# Patient Record
Sex: Male | Born: 1995 | Race: Black or African American | Hispanic: No | Marital: Single | Smoking: Never smoker
Health system: Southern US, Community
[De-identification: ages and names within clinical notes are randomized; demographics above are authoritative.]

## PROBLEM LIST (undated history)

## (undated) ENCOUNTER — Ambulatory Visit (HOSPITAL_COMMUNITY): Admission: EM | Payer: No Typology Code available for payment source | Source: Home / Self Care

## (undated) DIAGNOSIS — J45909 Unspecified asthma, uncomplicated: Secondary | ICD-10-CM

---
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
## 2013-06-22 ENCOUNTER — Emergency Department (HOSPITAL_COMMUNITY)
Admission: EM | Admit: 2013-06-22 | Discharge: 2013-06-22 | Disposition: A | Discharge status: Home / Self Care | Payer: 59 | Attending: Emergency Medicine | Admitting: Emergency Medicine

## 2013-06-22 ENCOUNTER — Emergency Department (HOSPITAL_COMMUNITY): Payer: 59

## 2013-06-22 ENCOUNTER — Encounter (HOSPITAL_COMMUNITY): Payer: Self-pay | Admitting: Emergency Medicine

## 2013-06-22 DIAGNOSIS — R5383 Other fatigue: Secondary | ICD-10-CM

## 2013-06-22 DIAGNOSIS — IMO0001 Reserved for inherently not codable concepts without codable children: Secondary | ICD-10-CM | POA: Insufficient documentation

## 2013-06-22 DIAGNOSIS — J069 Acute upper respiratory infection, unspecified: Secondary | ICD-10-CM | POA: Insufficient documentation

## 2013-06-22 DIAGNOSIS — Z79899 Other long term (current) drug therapy: Secondary | ICD-10-CM | POA: Insufficient documentation

## 2013-06-22 DIAGNOSIS — R5381 Other malaise: Secondary | ICD-10-CM | POA: Insufficient documentation

## 2013-06-22 DIAGNOSIS — J45901 Unspecified asthma with (acute) exacerbation: Secondary | ICD-10-CM

## 2013-06-22 DIAGNOSIS — R509 Fever, unspecified: Secondary | ICD-10-CM

## 2013-06-22 MED ORDER — ALBUTEROL SULFATE HFA 108 (90 BASE) MCG/ACT IN AERS
2.0000 | INHALATION_SPRAY | RESPIRATORY_TRACT | Status: DC | PRN
  Administered 2013-06-22: 2 via RESPIRATORY_TRACT
  Filled 2013-06-22 (×2): qty 6.7

## 2013-06-22 MED ORDER — IBUPROFEN 400 MG PO TABS
600.0000 mg | ORAL_TABLET | Freq: Once | ORAL | Status: AC
  Administered 2013-06-22: 600 mg via ORAL
  Filled 2013-06-22 (×2): qty 1

## 2013-06-22 MED ORDER — ACETAMINOPHEN 500 MG PO TABS
1000.0000 mg | ORAL_TABLET | Freq: Once | ORAL | Status: AC
  Administered 2013-06-22: 1000 mg via ORAL
  Filled 2013-06-22: qty 2

## 2013-06-22 MED ORDER — ALBUTEROL SULFATE (2.5 MG/3ML) 0.083% IN NEBU
5.0000 mg | INHALATION_SOLUTION | Freq: Once | RESPIRATORY_TRACT | Status: AC
  Administered 2013-06-22: 5 mg via RESPIRATORY_TRACT
  Filled 2013-06-22: qty 6

## 2013-06-22 NOTE — ED Notes (Signed)
Pt. reports wheezing with occasional dry cough and headache onset today .

## 2013-06-22 NOTE — ED Provider Notes (Signed)
CSN: [id number]     Arrival date & time 06/22/13  1959 History   First MD Initiated Contact with Patient 06/22/13 2119     Chief Complaint  Patient presents with  . Wheezing     (Consider location/radiation/quality/duration/timing/severity/associated sxs/prior Treatment) HPI Pt is a 18yo male with hx of asthma presenting today with wheezing and occasional dry cough associated with headache and body aches today. Headache is generalized, aching, 8/10, gradually worsening. Denies dizziness or change in vision. Denies neck pain. Reports gradually worsening symptoms x2 days. Reports he lost his inhaler. Reports hot and cold chills but did not measure his temperature. No known sick contacts. Denies vomiting or diarrhea.   Past Medical History  Diagnosis Date  . Asthma    History reviewed. No pertinent past surgical history. No family history on file. History  Substance Use Topics  . Smoking status: Never Smoker   . Smokeless tobacco: Not on file  . Alcohol Use: No    Review of Systems  Constitutional: Positive for fever, chills and fatigue.  HENT: Positive for congestion.   Respiratory: Positive for cough, shortness of breath and wheezing. Negative for stridor.   Cardiovascular: Negative for chest pain.  Gastrointestinal: Negative for nausea, vomiting, abdominal pain and diarrhea.  Musculoskeletal: Positive for myalgias.  Skin: Negative for rash.  Neurological: Positive for headaches. Negative for dizziness, weakness and light-headedness.  All other systems reviewed and are negative.      Allergies  Review of patient's allergies indicates no known allergies.  Home Medications   Current Outpatient Rx  Name  Route  Sig  Dispense  Refill  . albuterol (PROVENTIL HFA;VENTOLIN HFA) 108 (90 BASE) MCG/ACT inhaler   Inhalation   Inhale 2 puffs into the lungs 2 (two) times daily as needed for wheezing or shortness of breath.         Marland Kitchen ibuprofen (ADVIL,MOTRIN) 200 MG tablet    Oral   Take 400 mg by mouth daily as needed (pain).          BP 116/60  Pulse 104  Temp(Src) 101.1 F (38.4 C) (Oral)  Resp 18  Ht 6' (1.829 m)  SpO2 98% Physical Exam  Nursing note and vitals reviewed. Constitutional: He appears well-developed and well-nourished.  HENT:  Head: Normocephalic and atraumatic.  Right Ear: Hearing, tympanic membrane, external ear and ear canal normal.  Left Ear: Hearing, tympanic membrane, external ear and ear canal normal.  Nose: Mucosal edema present.  Mouth/Throat: Uvula is midline, oropharynx is clear and moist and mucous membranes are normal.  Eyes: Conjunctivae are normal. No scleral icterus.  Neck: Normal range of motion. Neck supple.  No nuchal rigidity or meningeal signs.  Cardiovascular: Normal rate, regular rhythm and normal heart sounds.   Pulmonary/Chest: Effort normal and breath sounds normal. No respiratory distress. He has no wheezes. He has no rales. He exhibits no tenderness.  No respiratory distress, able to speak in full sentences w/o difficulty. Lungs: CTAB  Abdominal: Soft. Bowel sounds are normal. He exhibits no distension and no mass. There is no tenderness. There is no rebound and no guarding.  Musculoskeletal: Normal range of motion.  Neurological: He is alert.  Skin: Skin is warm and dry.    ED Course  Procedures (including critical care time) Labs Review Labs Reviewed - No data to display Imaging Review Dg Chest 2 View  06/22/2013   CLINICAL DATA:  Wheezing for 3 days. History of asthma.  EXAM: CHEST  2 VIEW  COMPARISON:  None.  FINDINGS: The lungs are well-aerated and clear. There is no evidence of focal opacification, pleural effusion or pneumothorax.  The heart is normal in size; the mediastinal contour is within normal limits. No acute osseous abnormalities are seen.  IMPRESSION: No acute cardiopulmonary process seen.   Electronically Signed   By: Roanna RaiderJeffery  Chang M.D.   On: 06/22/2013 21:23    EKG Interpretation    None       MDM   Final diagnoses:  Asthma attack  URI, acute  Fever    pt with hx of asthma presenting with wheeze, dry cough, fever, and body aches.  Albuterol neb tx given in triage.  CXR: no acute cardiopulmonary process seen.  Pt states he is feeling better after breathing tx. Lungs:CTAB.  Pt is febrile-102.4, tylenol and ibuprofen given in ED, temperature responding to Tx in ED.  Will tx pt symptomatically as no evidence of bronchitis or pneumonia. Albuterol inhaler provided in ED. Advised pt to use acetaminophen and ibuprofen as needed for fever and pain. Encouraged rest and fluids. Return precautions provided. Pt verbalized understanding and agreement with tx plan.     Junius FinnerErin O'Malley, PA-C 06/23/13 [phone]

## 2013-06-22 NOTE — ED Notes (Signed)
Patient transported to X-ray 

## 2013-06-22 NOTE — Discharge Instructions (Signed)
Take 400-600mg Ibuprofen (Motrin) every 6-8 hours for fever and pain  °Alternate with Tylenol  °Take 500mg Tylenol every 4-6 hours as needed for fever and pain  °Follow-up with your primary care provider next week for recheck of symptoms if not improving.  °Be sure to drink plenty of fluids and rest, at least 8hrs of sleep a night, preferably more while you are sick. °Return to the ED if you cannot keep down fluids/signs of dehydration, fever not reducing with Tylenol, difficulty breathing/wheezing, stiff neck, worsening condition, or other concerns (see below)  ° °

## 2013-06-22 NOTE — ED Notes (Signed)
Pt back from x-ray.

## 2013-06-23 NOTE — ED Provider Notes (Signed)
Medical screening examination/treatment/procedure(s) were performed by non-physician practitioner and as supervising physician I was immediately available for consultation/collaboration.  EKG Interpretation   None        Shon Batonourtney F Horton, MD 06/23/13 [phone]

## 2014-12-13 ENCOUNTER — Emergency Department (HOSPITAL_COMMUNITY)
Admission: EM | Admit: 2014-12-13 | Discharge: 2014-12-13 | Disposition: A | Discharge status: Home / Self Care | Payer: Commercial Managed Care - HMO | Attending: Emergency Medicine | Admitting: Emergency Medicine

## 2014-12-13 ENCOUNTER — Encounter (HOSPITAL_COMMUNITY): Payer: Self-pay | Admitting: Cardiology

## 2014-12-13 DIAGNOSIS — J4521 Mild intermittent asthma with (acute) exacerbation: Secondary | ICD-10-CM | POA: Insufficient documentation

## 2014-12-13 DIAGNOSIS — Z79899 Other long term (current) drug therapy: Secondary | ICD-10-CM | POA: Diagnosis not present

## 2014-12-13 DIAGNOSIS — R0602 Shortness of breath: Secondary | ICD-10-CM | POA: Diagnosis present

## 2014-12-13 MED ORDER — IPRATROPIUM-ALBUTEROL 0.5-2.5 (3) MG/3ML IN SOLN
3.0000 mL | Freq: Once | RESPIRATORY_TRACT | Status: AC
  Administered 2014-12-13: 3 mL via RESPIRATORY_TRACT

## 2014-12-13 MED ORDER — IPRATROPIUM-ALBUTEROL 0.5-2.5 (3) MG/3ML IN SOLN
RESPIRATORY_TRACT | Status: AC
Start: 2014-12-13 — End: 2014-12-13
  Administered 2014-12-13: 3 mL via RESPIRATORY_TRACT
  Filled 2014-12-13: qty 3

## 2014-12-13 MED ORDER — ALBUTEROL SULFATE (2.5 MG/3ML) 0.083% IN NEBU
2.5000 mg | INHALATION_SOLUTION | Freq: Once | RESPIRATORY_TRACT | Status: AC
  Administered 2014-12-13: 2.5 mg via RESPIRATORY_TRACT
  Filled 2014-12-13: qty 3

## 2014-12-13 MED ORDER — IPRATROPIUM-ALBUTEROL 0.5-2.5 (3) MG/3ML IN SOLN: 3.0000 mL | Freq: Once | RESPIRATORY_TRACT | Status: DC

## 2014-12-13 MED ORDER — ALBUTEROL SULFATE HFA 108 (90 BASE) MCG/ACT IN AERS: 2.0000 | INHALATION_SPRAY | RESPIRATORY_TRACT | Status: DC | PRN

## 2014-12-13 MED ORDER — PREDNISONE 20 MG PO TABS: 40.0000 mg | ORAL_TABLET | Freq: Every day | ORAL | Status: DC

## 2014-12-13 NOTE — ED Provider Notes (Signed)
I saw and evaluated the patient, reviewed the resident's note and I agree with the findings and plan.  Pertinent History: The patient presents with shortness of breath and wheezing Pertinent Exam findings: On exam the patient after getting a liter all treatments has improved breathing, normal lung sounds, no wheezing, speaks in full sentences though he does have a nasal and congested sound to his voice. I clearly reactive airway disease possibly related to upper respiratory infection, no indication for antibiotics or imaging is improved significantly with medications. Anticipate home with prednisone and refill for albuterol MDI. Patient is in agreement.  Final diagnoses:  Asthma, mild intermittent, with acute exacerbation      Eber Hong, MD 12/14/14 [phone]

## 2014-12-13 NOTE — ED Provider Notes (Signed)
CSN: [id number]     Arrival date & time 12/13/14  1247 History   First MD Initiated Contact with Patient 12/13/14 1254     Chief Complaint  Patient presents with  . Shortness of Breath   HPI  Mr. Pellegrin is a 19yo male presenting today with asthma exacerbation. Reports wheezing and shortness of breath x2 days. Denies fever. Also reports cough. States this feels like previous asthma exacerbations he has had. Has been out of his albuterol inhaler since April. No further concerns today.  Past Medical History  Diagnosis Date  . Asthma    History reviewed. No pertinent past surgical history. History reviewed. No pertinent family history. Social History  Substance Use Topics  . Smoking status: Never Smoker   . Smokeless tobacco: None  . Alcohol Use: No    Review of Systems  Constitutional: Negative for fever.  Respiratory: Positive for cough, shortness of breath and wheezing.       Allergies  Review of patient's allergies indicates no known allergies.  Home Medications   Prior to Admission medications   Medication Sig Start Date End Date Taking? Authorizing Provider  albuterol (PROVENTIL HFA;VENTOLIN HFA) 108 (90 BASE) MCG/ACT inhaler Inhale 2 puffs into the lungs 2 (two) times daily as needed for wheezing or shortness of breath.   Yes Historical Provider, MD  ibuprofen (ADVIL,MOTRIN) 200 MG tablet Take 400 mg by mouth daily as needed (pain).   Yes Historical Provider, MD   BP 133/73 mmHg  Pulse 76  Temp(Src) 98.6 F (37 C) (Oral)  Ht  (1.854 m)  Wt 165 lb (74.844 kg)  BMI 21.77 kg/m2  SpO2 96% Physical Exam  Constitutional: He is oriented to person, place, and time. He appears well-developed and well-nourished. No distress.  HENT:  Head: Normocephalic and atraumatic.  Right and Left TM normal without erythema  Cardiovascular: Normal rate and regular rhythm.  Exam reveals friction rub. Exam reveals no gallop.   No murmur heard. Pulmonary/Chest: Effort normal. No  respiratory distress. He has wheezes. He has no rales.  Abdominal: Soft. He exhibits no distension. There is no tenderness.  Musculoskeletal: He exhibits no edema.  Neurological: He is alert and oriented to person, place, and time.  Skin: Skin is warm and dry. No rash noted.  Psychiatric: He has a normal mood and affect. His behavior is normal.    ED Course  Procedures (including critical care time) Labs Review Labs Reviewed - No data to display  Imaging Review No results found. I have personally reviewed and evaluated these images and lab results as part of my medical decision-making.   EKG Interpretation None      MDM   Final diagnoses:  None  Nebulizer given. Wheezing resolved and clear to auscultation following treatment. Stable for discharge with Albuterol inhaler and short burst of steroids. Follow up with PCP.    [address] [place], [place] 12/13/14 1416  Eber Hong, MD 12/14/14 [phone]

## 2014-12-13 NOTE — Discharge Instructions (Signed)

## 2014-12-13 NOTE — ED Notes (Signed)
Sob times 2 days.   

## 2015-05-22 ENCOUNTER — Emergency Department (HOSPITAL_COMMUNITY): Payer: Commercial Managed Care - HMO

## 2015-05-22 ENCOUNTER — Encounter (HOSPITAL_COMMUNITY): Payer: Self-pay

## 2015-05-22 ENCOUNTER — Emergency Department (HOSPITAL_COMMUNITY)
Admission: EM | Admit: 2015-05-22 | Discharge: 2015-05-22 | Disposition: A | Discharge status: Home / Self Care | Payer: Commercial Managed Care - HMO | Attending: Emergency Medicine | Admitting: Emergency Medicine

## 2015-05-22 DIAGNOSIS — Z79899 Other long term (current) drug therapy: Secondary | ICD-10-CM | POA: Diagnosis not present

## 2015-05-22 DIAGNOSIS — Z7952 Long term (current) use of systemic steroids: Secondary | ICD-10-CM | POA: Diagnosis not present

## 2015-05-22 DIAGNOSIS — J45901 Unspecified asthma with (acute) exacerbation: Secondary | ICD-10-CM | POA: Insufficient documentation

## 2015-05-22 DIAGNOSIS — M79641 Pain in right hand: Secondary | ICD-10-CM | POA: Diagnosis present

## 2015-05-22 MED ORDER — IPRATROPIUM-ALBUTEROL 0.5-2.5 (3) MG/3ML IN SOLN
3.0000 mL | Freq: Once | RESPIRATORY_TRACT | Status: AC
  Administered 2015-05-22: 3 mL via RESPIRATORY_TRACT
  Filled 2015-05-22: qty 3

## 2015-05-22 NOTE — ED Notes (Signed)
Pt states that he hurt his left hand several months ago playing baseball, by getting hit in the hand with the ball, able to move fingertips, positive PMS.

## 2015-05-22 NOTE — ED Provider Notes (Signed)
CSN: [id number]     Arrival date & time 05/22/15  2011 History  By signing my name below, I, Keith Contreras, attest that this documentation has been prepared under the direction and in the presence of [facility], PA-C. Electronically Signed: Gonzella Contreras, Scribe. 05/22/2015. 8:42 PM.  Chief Complaint  Patient presents with  . Hand Pain   The history is provided by the patient. No language interpreter was used.   HPI Comments: Keith Contreras is a 20 y.o. male who presents to the Emergency Department complaining of sudden onset of constant, mild, left hand pain after being hit by a baseball that was traveling at a significant speed about two months ago. Over the past two months, pain is intermittent with certain movements. Taken ASA prior to arrival. Pt also notes wheezing secondary to his asthma. He states that he has an inhaler at home which he has tried taking with mild improvement.    Past Medical History  Diagnosis Date  . Asthma    History reviewed. No pertinent past surgical history. No family history on file. Social History  Substance Use Topics  . Smoking status: Never Smoker   . Smokeless tobacco: None  . Alcohol Use: No    Review of Systems  Respiratory: Positive for wheezing.   Musculoskeletal:       Hand injury    Allergies  Review of patient's allergies indicates no known allergies.  Home Medications   Prior to Admission medications   Medication Sig Start Date End Date Taking? Authorizing Provider  albuterol (PROVENTIL HFA;VENTOLIN HFA) 108 (90 BASE) MCG/ACT inhaler Inhale 2 puffs into the lungs every 4 (four) hours as needed for wheezing or shortness of breath. 12/13/14   [place] N Rumley, DO  ibuprofen (ADVIL,MOTRIN) 200 MG tablet Take 400 mg by mouth daily as needed (pain).    Historical Provider, MD  predniSONE (DELTASONE) 20 MG tablet Take 2 tablets (40 mg total) by mouth daily with breakfast. 12/13/14   [place] N Rumley, DO   BP 151/90 mmHg   Pulse 56  Temp(Src) 98.7 F (37.1 C) (Oral)  Resp 18  SpO2 95% Physical Exam  Constitutional: He is oriented to person, place, and time. He appears well-developed and well-nourished. No distress.  HENT:  Head: Normocephalic and atraumatic.  Mouth/Throat: Oropharynx is clear and moist.  Neck: Normal range of motion. Neck supple. No tracheal deviation present.  Cardiovascular: Normal rate, regular rhythm and normal heart sounds.   Pulmonary/Chest: Effort normal. No respiratory distress. He has wheezes. He has no rales.  Abdominal: Soft. He exhibits no distension.  Musculoskeletal:  TTP of left proximal 3rd metacarpal area.  No snuffbox tenderness No erythema, edema, ecchymosis, or deformities noted.   Lymphadenopathy:    He has no cervical adenopathy.  Neurological: He is alert and oriented to person, place, and time.  Skin: Skin is warm and dry. No erythema.  Psychiatric: He has a normal mood and affect.  Nursing note and vitals reviewed.   ED Course  Procedures  DIAGNOSTIC STUDIES:    Oxygen Saturation is 95% on RA, adeqaute by my interpretation.   COORDINATION OF CARE:  8:40 PM Will order x ray of pt's left hand. Will administer breathing treatment in the ED. Discussed treatment plan with pt at bedside and pt agreed to plan.   Imaging Review Dg Hand Complete Left  05/22/2015  CLINICAL DATA:  Patient was hit in the left hand by a baseball about 2 months ago. Pain since  the injury in the proximal second metacarpal with a visible bump in that location. EXAM: LEFT HAND - COMPLETE 3+ VIEW COMPARISON:  None. FINDINGS: There is no evidence of fracture or dislocation. There is no evidence of arthropathy or other focal bone abnormality. Soft tissues are unremarkable. IMPRESSION: Negative. Electronically Signed   By: Burman Nieves M.D.   On: 05/22/2015 21:24   I have personally reviewed and evaluated these images as part of my medical decision-making.  MDM   Final diagnoses:   Hand pain, right   Keith Contreras presents with two month history of intermittent left hand pain. Also admits to wheezing, hx of asthma - wheezing bilaterally on exam. Will obtain x-ray and administer breathing treatment.  Negative hand x-ray, lungs without wheezing after duoneb.  Ortho follow up given if symptoms persist longer than 2 weeks. Home care instructions given.   I personally performed the services described in this documentation, which was scribed in my presence. The recorded information has been reviewed and is accurate.  [facility], PA-C 05/22/15 2141  Vanetta Mulders, MD 05/23/15 [phone]

## 2015-05-22 NOTE — Discharge Instructions (Signed)
Ibuprofen or motrin for pain Ice as needed - directions below Follow up with orthopedic clinic listed if pain persists for 2 weeks.  Return to ED for new or worsening symptoms, any additional concerns.   COLD THERAPY DIRECTIONS:  Ice or gel packs can be used to reduce both pain and swelling. Ice is the most helpful within the first 24 to 48 hours after an injury or flareup from overusing a muscle or joint.  Ice is effective, has very few side effects, and is safe for most people to use.   If you expose your skin to cold temperatures for too long or without the proper protection, you can damage your skin or nerves. Watch for signs of skin damage due to cold.   HOME CARE INSTRUCTIONS  Follow these tips to use ice and cold packs safely.  Place a dry or damp towel between the ice and skin. A damp towel will cool the skin more quickly, so you may need to shorten the time that the ice is used.  For a more rapid response, add gentle compression to the ice.  Ice for no more than 10 to 20 minutes at a time. The bonier the area you are icing, the less time it will take to get the benefits of ice.  Check your skin after 5 minutes to make sure there are no signs of a poor response to cold or skin damage.  Rest 20 minutes or more in between uses.  Once your skin is numb, you can end your treatment. You can test numbness by very lightly touching your skin. The touch should be so light that you do not see the skin dimple from the pressure of your fingertip. When using ice, most people will feel these normal sensations in this order: cold, burning, aching, and numbness.  Do not use ice on someone who cannot communicate their responses to pain, such as small children or people with dementia.

## 2015-06-06 ENCOUNTER — Emergency Department (HOSPITAL_COMMUNITY): Payer: Commercial Managed Care - HMO

## 2015-06-06 ENCOUNTER — Emergency Department (HOSPITAL_COMMUNITY)
Admission: EM | Admit: 2015-06-06 | Discharge: 2015-06-06 | Disposition: A | Discharge status: Home / Self Care | Payer: Commercial Managed Care - HMO | Attending: Emergency Medicine | Admitting: Emergency Medicine

## 2015-06-06 ENCOUNTER — Encounter (HOSPITAL_COMMUNITY): Payer: Self-pay

## 2015-06-06 DIAGNOSIS — J45909 Unspecified asthma, uncomplicated: Secondary | ICD-10-CM | POA: Diagnosis present

## 2015-06-06 DIAGNOSIS — J45901 Unspecified asthma with (acute) exacerbation: Secondary | ICD-10-CM | POA: Diagnosis not present

## 2015-06-06 MED ORDER — ACETAMINOPHEN 325 MG PO TABS
ORAL_TABLET | ORAL | Status: AC
  Filled 2015-06-06: qty 2

## 2015-06-06 MED ORDER — ALBUTEROL SULFATE (2.5 MG/3ML) 0.083% IN NEBU
5.0000 mg | INHALATION_SOLUTION | Freq: Once | RESPIRATORY_TRACT | Status: AC
  Administered 2015-06-06: 5 mg via RESPIRATORY_TRACT

## 2015-06-06 MED ORDER — ALBUTEROL SULFATE (2.5 MG/3ML) 0.083% IN NEBU
INHALATION_SOLUTION | RESPIRATORY_TRACT | Status: AC
  Filled 2015-06-06: qty 6

## 2015-06-06 MED ORDER — ACETAMINOPHEN 325 MG PO TABS
650.0000 mg | ORAL_TABLET | Freq: Once | ORAL | Status: AC | PRN
  Administered 2015-06-06: 650 mg via ORAL

## 2015-06-06 NOTE — ED Notes (Signed)
Called pt multiple times with no answer.  

## 2015-06-06 NOTE — ED Notes (Signed)
Pt here for asthma exacerbation onset yesterday. Associated symptoms include body weakness and chills. Pt speaking clear complete sentences, respirations unlabored.

## 2015-06-06 NOTE — ED Notes (Signed)
Called for pt X2 in waiting room, no response.

## 2015-06-09 ENCOUNTER — Encounter (HOSPITAL_COMMUNITY): Payer: Self-pay

## 2015-06-09 ENCOUNTER — Emergency Department (HOSPITAL_COMMUNITY)
Admission: EM | Admit: 2015-06-09 | Discharge: 2015-06-09 | Disposition: A | Discharge status: Home / Self Care | Payer: Commercial Managed Care - HMO | Attending: Emergency Medicine | Admitting: Emergency Medicine

## 2015-06-09 DIAGNOSIS — Z79899 Other long term (current) drug therapy: Secondary | ICD-10-CM | POA: Diagnosis not present

## 2015-06-09 DIAGNOSIS — Z87891 Personal history of nicotine dependence: Secondary | ICD-10-CM | POA: Diagnosis not present

## 2015-06-09 DIAGNOSIS — J45901 Unspecified asthma with (acute) exacerbation: Secondary | ICD-10-CM | POA: Diagnosis not present

## 2015-06-09 DIAGNOSIS — R062 Wheezing: Secondary | ICD-10-CM

## 2015-06-09 DIAGNOSIS — R0602 Shortness of breath: Secondary | ICD-10-CM | POA: Diagnosis present

## 2015-06-09 DIAGNOSIS — J111 Influenza due to unidentified influenza virus with other respiratory manifestations: Secondary | ICD-10-CM | POA: Diagnosis not present

## 2015-06-09 DIAGNOSIS — R69 Illness, unspecified: Secondary | ICD-10-CM

## 2015-06-09 MED ORDER — IPRATROPIUM-ALBUTEROL 0.5-2.5 (3) MG/3ML IN SOLN
3.0000 mL | RESPIRATORY_TRACT | Status: DC
  Administered 2015-06-09: 3 mL via RESPIRATORY_TRACT
  Filled 2015-06-09: qty 3

## 2015-06-09 MED ORDER — PREDNISONE 20 MG PO TABS: ORAL_TABLET | ORAL | Status: DC

## 2015-06-09 MED ORDER — ALBUTEROL SULFATE HFA 108 (90 BASE) MCG/ACT IN AERS
2.0000 | INHALATION_SPRAY | Freq: Once | RESPIRATORY_TRACT | Status: AC
  Administered 2015-06-09: 2 via RESPIRATORY_TRACT
  Filled 2015-06-09: qty 6.7

## 2015-06-09 MED ORDER — ACETAMINOPHEN 500 MG PO TABS: 1000.0000 mg | ORAL_TABLET | Freq: Four times a day (QID) | ORAL | Status: DC | PRN

## 2015-06-09 MED ORDER — PROMETHAZINE-DM 6.25-15 MG/5ML PO SYRP: 5.0000 mL | ORAL_SOLUTION | Freq: Four times a day (QID) | ORAL | Status: DC | PRN

## 2015-06-09 MED ORDER — IBUPROFEN 800 MG PO TABS
800.0000 mg | ORAL_TABLET | Freq: Once | ORAL | Status: AC
  Administered 2015-06-09: 800 mg via ORAL
  Filled 2015-06-09: qty 1

## 2015-06-09 NOTE — ED Provider Notes (Signed)
CSN: [id number]     Arrival date & time 06/09/15  1014 History   First MD Initiated Contact with Patient 06/09/15 1106     Chief Complaint  Patient presents with  . Shortness of Breath  . Generalized Body Aches  . Cough     (Consider location/radiation/quality/duration/timing/severity/associated sxs/prior Treatment) HPI Patiemt reports that he has had a cough with sputum. Patient poor she's been short of breath. He reports he's had fever up to 101. Symptoms started at the beginning of the week. He has had symptoms at least 5-7 days now. He reports generalized body aches. No vomiting or diarrhea. Past Medical History  Diagnosis Date  . Asthma    History reviewed. No pertinent past surgical history. Family History  Problem Relation Age of Onset  . Diabetes Father    Social History  Substance Use Topics  . Smoking status: Former Games developer  . Smokeless tobacco: Never Used  . Alcohol Use: No    Review of Systems  10 Systems reviewed and are negative for acute change except as noted in the HPI.   Allergies  Review of patient's allergies indicates no known allergies.  Home Medications   Prior to Admission medications   Medication Sig Start Date End Date Taking? Authorizing Provider  acetaminophen (TYLENOL) 500 MG tablet Take 1,000 mg by mouth every 6 (six) hours as needed for mild pain, moderate pain or headache.   Yes Historical Provider, MD  albuterol (PROVENTIL HFA;VENTOLIN HFA) 108 (90 BASE) MCG/ACT inhaler Inhale 2 puffs into the lungs every 4 (four) hours as needed for wheezing or shortness of breath. 12/13/14  [place] N Rumley, DO  ibuprofen (ADVIL,MOTRIN) 200 MG tablet Take 400 mg by mouth daily as needed (pain).   Yes Historical Provider, MD  Omega-3 Fatty Acids (FISH OIL) 1000 MG CAPS Take 1,000 mg by mouth daily.   Yes Historical Provider, MD  acetaminophen (TYLENOL) 500 MG tablet Take 2 tablets (1,000 mg total) by mouth every 6 (six) hours as needed. 06/09/15   Arby Barrette, MD  predniSONE (DELTASONE) 20 MG tablet Take 2 tablets (40 mg total) by mouth daily with breakfast. Patient not taking: Reported on 06/09/2015 12/13/14   Lora [place] Rumley, DO  predniSONE (DELTASONE) 20 MG tablet 3 tabs po day one, then 2 tabs daily x 4 days 06/09/15   Arby Barrette, MD  promethazine-dextromethorphan (PROMETHAZINE-DM) 6.25-15 MG/5ML syrup Take 5 mLs by mouth 4 (four) times daily as needed for cough. 06/09/15   Arby Barrette, MD   BP 126/71 mmHg  Pulse 86  Temp(Src) 97.6 F (36.4 C) (Oral)  Resp 16  SpO2 98% Physical Exam  Constitutional: He is oriented to person, place, and time. He appears well-developed and well-nourished.  HENT:  Head: Normocephalic and atraumatic.  Mouth/Throat: Oropharynx is clear and moist.  Eyes: EOM are normal. Pupils are equal, round, and reactive to light.  Neck: Neck supple.  Cardiovascular: Normal rate, regular rhythm, normal heart sounds and intact distal pulses.   Pulmonary/Chest: Effort normal.  Expiratory wheeze bilaterally. Adequate air flow to basis.  Abdominal: Soft. Bowel sounds are normal. He exhibits no distension. There is no tenderness.  Musculoskeletal: Normal range of motion. He exhibits no edema.  Neurological: He is alert and oriented to person, place, and time. He has normal strength. Coordination normal. GCS eye subscore is 4. GCS verbal subscore is 5. GCS motor subscore is 6.  Skin: Skin is warm, dry and intact.  Psychiatric: He has a normal mood  and affect.    ED Course  Procedures (including critical care time) Labs Review Labs Reviewed - No data to display  Imaging Review No results found. I have personally reviewed and evaluated these images and lab results as part of my medical decision-making.   EKG Interpretation None      MDM   Final diagnoses:  Influenza-like illness  Wheezing   Patient presents with continued cough. He also reports fever and generalized myalgia. Patient had chest x-ray  done 3 days ago that did not show any focal pneumonia. He had come to the ED but did not await assessment however he had chest x-ray done. At this time he is nontoxic and alert. Vital signs are stable. Findings are suggestive of influenza-like illness with fever and general myalgia. At this time he has had 3 days and not a candidate for Tamiflu. Patient does have asthma however he is speaking in full sentences and does not have any acute respiratory distress.  He'll be given a short course of steroids with an inhaler and cough suppressant. Patient's counseled on signs and symptoms to return.    Arby Barrette, MD 06/09/15 [phone]

## 2015-06-09 NOTE — Discharge Instructions (Signed)
Suspected Influenza, Adult °Influenza ("the flu") is a viral infection of the respiratory tract. It occurs more often in winter months because people spend more time in close contact with one another. Influenza can make you feel very sick. Influenza easily spreads from person to person (contagious). °CAUSES  °Influenza is caused by a virus that infects the respiratory tract. You can catch the virus by breathing in droplets from an infected person's cough or sneeze. You can also catch the virus by touching something that was recently contaminated with the virus and then touching your mouth, nose, or eyes. °RISKS AND COMPLICATIONS °You may be at risk for a more severe case of influenza if you smoke cigarettes, have diabetes, have chronic heart disease (such as heart failure) or lung disease (such as asthma), or if you have a weakened immune system. Elderly people and pregnant women are also at risk for more serious infections. The most common problem of influenza is a lung infection (pneumonia). Sometimes, this problem can require emergency medical care and may be life threatening. °SIGNS AND SYMPTOMS  °Symptoms typically last 4 to 10 days and may include: °· Fever. °· Chills. °· Headache, body aches, and muscle aches. °· Sore throat. °· Chest discomfort and cough. °· Poor appetite. °· Weakness or feeling tired. °· Dizziness. °· Nausea or vomiting. °DIAGNOSIS  °Diagnosis of influenza is often made based on your history and a physical exam. A nose or throat swab test can be done to confirm the diagnosis. °TREATMENT  °In mild cases, influenza goes away on its own. Treatment is directed at relieving symptoms. For more severe cases, your health care provider may prescribe antiviral medicines to shorten the sickness. Antibiotic medicines are not effective because the infection is caused by a virus, not by bacteria. °HOME CARE INSTRUCTIONS °· Take medicines only as directed by your health care provider. °· Use a cool mist  humidifier to make breathing easier. °· Get plenty of rest until your temperature returns to normal. This usually takes 3 to 4 days. °· Drink enough fluid to keep your urine clear or pale yellow. °· Cover your mouth and nose when coughing or sneezing, and wash your hands well to prevent the virus from spreading. °· Stay home from work or school until the fever is gone for at least 1 full day. °PREVENTION  °An annual influenza vaccination (flu shot) is the best way to avoid getting influenza. An annual flu shot is now routinely recommended for all adults in the [place] °SEEK MEDICAL CARE IF: °· You experience chest pain, your cough worsens, or you produce more mucus. °· You have nausea, vomiting, or diarrhea. °· Your fever returns or gets worse. °SEEK IMMEDIATE MEDICAL CARE IF: °· You have trouble breathing, you become short of breath, or your skin or nails become bluish. °· You have severe pain or stiffness in the neck. °· You develop a sudden headache, or pain in the face or ear. °· You have nausea or vomiting that you cannot control. °MAKE SURE YOU:  °· Understand these instructions. °· Will watch your condition. °· Will get help right away if you are not doing well or get worse. °  °This information is not intended to replace advice given to you by your health care provider. Make sure you discuss any questions you have with your health care provider. °  °Document Released: 04/12/2000 Document Revised: 05/06/2014 Document Reviewed: 07/15/2011 °Elsevier Interactive Patient Education ©2016 [employer] ° °

## 2015-06-09 NOTE — ED Notes (Signed)
Patient c/o productive cough with yellow/green sputum. SOB, and body aches x 3 days. Patient states he had a fever of 101.0 orally 3 days days ago.

## 2015-08-14 ENCOUNTER — Emergency Department (HOSPITAL_COMMUNITY)
Admission: EM | Admit: 2015-08-14 | Discharge: 2015-08-14 | Disposition: A | Discharge status: Home / Self Care | Payer: Commercial Managed Care - HMO | Attending: Emergency Medicine | Admitting: Emergency Medicine

## 2015-08-14 ENCOUNTER — Encounter (HOSPITAL_COMMUNITY): Payer: Self-pay | Admitting: Emergency Medicine

## 2015-08-14 DIAGNOSIS — Y9289 Other specified places as the place of occurrence of the external cause: Secondary | ICD-10-CM | POA: Diagnosis not present

## 2015-08-14 DIAGNOSIS — J45909 Unspecified asthma, uncomplicated: Secondary | ICD-10-CM | POA: Diagnosis not present

## 2015-08-14 DIAGNOSIS — Y998 Other external cause status: Secondary | ICD-10-CM | POA: Insufficient documentation

## 2015-08-14 DIAGNOSIS — W57XXXA Bitten or stung by nonvenomous insect and other nonvenomous arthropods, initial encounter: Secondary | ICD-10-CM | POA: Insufficient documentation

## 2015-08-14 DIAGNOSIS — S80861A Insect bite (nonvenomous), right lower leg, initial encounter: Secondary | ICD-10-CM | POA: Insufficient documentation

## 2015-08-14 DIAGNOSIS — Y9389 Activity, other specified: Secondary | ICD-10-CM | POA: Insufficient documentation

## 2015-08-14 NOTE — ED Notes (Signed)
Pt states that he noticed a raised, open area on his R calf approx. 3 days ago. Thinks he was bit by something. Alert and oriented.

## 2015-08-15 ENCOUNTER — Emergency Department (HOSPITAL_COMMUNITY)
Admission: EM | Admit: 2015-08-15 | Discharge: 2015-08-16 | Disposition: A | Discharge status: Home / Self Care | Payer: Commercial Managed Care - HMO | Attending: Emergency Medicine | Admitting: Emergency Medicine

## 2015-08-15 ENCOUNTER — Encounter (HOSPITAL_COMMUNITY): Payer: Self-pay | Admitting: *Deleted

## 2015-08-15 DIAGNOSIS — Z87891 Personal history of nicotine dependence: Secondary | ICD-10-CM | POA: Diagnosis not present

## 2015-08-15 DIAGNOSIS — Z7951 Long term (current) use of inhaled steroids: Secondary | ICD-10-CM | POA: Insufficient documentation

## 2015-08-15 DIAGNOSIS — Z7952 Long term (current) use of systemic steroids: Secondary | ICD-10-CM | POA: Insufficient documentation

## 2015-08-15 DIAGNOSIS — Y939 Activity, unspecified: Secondary | ICD-10-CM | POA: Diagnosis not present

## 2015-08-15 DIAGNOSIS — Y929 Unspecified place or not applicable: Secondary | ICD-10-CM | POA: Insufficient documentation

## 2015-08-15 DIAGNOSIS — Z79899 Other long term (current) drug therapy: Secondary | ICD-10-CM | POA: Insufficient documentation

## 2015-08-15 DIAGNOSIS — Y999 Unspecified external cause status: Secondary | ICD-10-CM | POA: Diagnosis not present

## 2015-08-15 DIAGNOSIS — L0291 Cutaneous abscess, unspecified: Secondary | ICD-10-CM

## 2015-08-15 DIAGNOSIS — L02415 Cutaneous abscess of right lower limb: Secondary | ICD-10-CM | POA: Diagnosis not present

## 2015-08-15 DIAGNOSIS — J45909 Unspecified asthma, uncomplicated: Secondary | ICD-10-CM | POA: Insufficient documentation

## 2015-08-15 DIAGNOSIS — L0390 Cellulitis, unspecified: Secondary | ICD-10-CM

## 2015-08-15 DIAGNOSIS — W57XXXA Bitten or stung by nonvenomous insect and other nonvenomous arthropods, initial encounter: Secondary | ICD-10-CM | POA: Diagnosis not present

## 2015-08-15 DIAGNOSIS — S80861A Insect bite (nonvenomous), right lower leg, initial encounter: Secondary | ICD-10-CM | POA: Diagnosis present

## 2015-08-15 DIAGNOSIS — Z791 Long term (current) use of non-steroidal anti-inflammatories (NSAID): Secondary | ICD-10-CM | POA: Insufficient documentation

## 2015-08-15 MED ORDER — LIDOCAINE-EPINEPHRINE (PF) 1 %-1:200000 IJ SOLN
20.0000 mL | Freq: Once | INTRAMUSCULAR | Status: AC
  Administered 2015-08-16: 20 mL
  Filled 2015-08-15: qty 30

## 2015-08-15 NOTE — ED Notes (Signed)
Patient is alert and oriented x4.  He is complaining of right leg pain with possible insect bite That he states occurred over a week ago.  Currently he denies any pain.

## 2015-08-15 NOTE — ED Notes (Addendum)
I&D Tray and Lidocaine at Bedside.

## 2015-08-16 NOTE — Discharge Instructions (Signed)
Do not hesitate to return to the emergency room for any new, worsening or concerning symptoms. ° °Please obtain primary care using resource guide below. Let them know that you were seen in the emergency room and that they will need to obtain records for further outpatient management. ° ° ° ° °Incision and Drainage °Incision and drainage is a procedure in which a sac-like structure (cystic structure) is opened and drained. The area to be drained usually contains material such as pus, fluid, or blood.  °LET YOUR CAREGIVER KNOW ABOUT:  °· Allergies to medicine. °· Medicines taken, including vitamins, herbs, eyedrops, over-the-counter medicines, and creams. °· Use of steroids (by mouth or creams). °· Previous problems with anesthetics or numbing medicines. °· History of bleeding problems or blood clots. °· Previous surgery. °· Other health problems, including diabetes and kidney problems. °· Possibility of pregnancy, if this applies. °RISKS AND COMPLICATIONS °· Pain. °· Bleeding. °· Scarring. °· Infection. °BEFORE THE PROCEDURE  °You may need to have an ultrasound or other imaging tests to see how large or deep your cystic structure is. Blood tests may also be used to determine if you have an infection or how severe the infection is. You may need to have a tetanus shot. °PROCEDURE  °The affected area is cleaned with a cleaning fluid. The cyst area will then be numbed with a medicine (local anesthetic). A small incision will be made in the cystic structure. A syringe or catheter may be used to drain the contents of the cystic structure, or the contents may be squeezed out. The area will then be flushed with a cleansing solution. After cleansing the area, it is often gently packed with a gauze or another wound dressing. Once it is packed, it will be covered with gauze and tape or some other type of wound dressing.  °AFTER THE PROCEDURE  °· Often, you will be allowed to go home right after the procedure. °· You may be  given antibiotic medicine to prevent or heal an infection. °· If the area was packed with gauze or some other wound dressing, you will likely need to come back in 1 to 2 days to get it removed. °· The area should heal in about 14 days. °  °This information is not intended to replace advice given to you by your health care provider. Make sure you discuss any questions you have with your health care provider. °  °Document Released: 10/09/2000 Document Revised: 10/15/2011 Document Reviewed: 06/10/2011 °Elsevier Interactive Patient Education ©2016 Elsevier Inc. °Community Resource Guide Financial Assistance °The United Way’s “211” is a great source of information about community services available.  Access by dialing 2-1-1 from anywhere in [place], or by website -  www.nc211.org.  ° °Other Local Resources (Updated 04/2015) ° °Financial Assistance °  °Services ° °  °Phone Number and Address  °[facility] • Low-cost medical care - 1st and 3rd Saturday of every month °• Must not qualify for public or private insurance and must have limited income 336-350-1642 °[address] °[place], [place]  °  °[place] [facility] • Child care °• Emergency assistance for housing and utilities °• Food stamps °• Medicaid 336-570-6532 °[address] °[place], [place] [address] °  °[facility] • Low-cost medical care for children, communicable diseases, sexually-transmitted diseases, immunizations, maternity care, women’s health and family planning [phone] °[address] °[place], [place] [address]  ° [facility]  • Medication assistance   for [place] residents °• Must meet income requirements 336-538-8440 °[address] [place], [place].    °[facility] • Child care °• Emergency assistance for housing and utilities °• Food stamps °• Medicaid 336-694-4141 °[address] °[place], [place] [address]  °[facility]  • Low-cost medical care,  °• Monday through Friday, 9 am to 6 pm.   Accepts Medicare/Medicaid, and self-pay 336-832-4444 °[address] °[place], [place] [address]  °[facility] • Low-cost medical care - Monday through Friday, 8:30 am - 5:30 pm °• Accepts Medicaid and self-pay [phone] °[address] [place], [place] [address]   ° [facility] • Primary medical care, including for those with sickle cell disease °• Accepts Medicare, Medicaid, insurance and self-pay [phone] °[address] °[place], [place]  °[facility]  • Primary medical care °• Accepts Medicare, Medicaid, insurance and self-pay 336-641-2100 °[address] [place], [place] [address] °  °[place] [facility] • Child care °• Emergency assistance for housing and utilities °• Food stamps °• Medicaid 336-703-3800 °[address] °[place], [place] [address]  °[place] [facility] • Child care °• Emergency assistance for housing and utilities °• Food stamps °• Medicaid 336-641-3000 °[address] °[place], [place] [address] °  °[facility] • Medication assistance for [place] residents with no insurance only °• Must have a primary care doctor 336-641-8030 °[address] °[place], [place]  °[facility]  • Primary medical care °• Accepts Medicare, Medicaid, insurance  [phone] °[address] °[place], [place]  °[facility] ° • Medication assistance [phone]  °[facility]  • Primary medical care °• Accepts Medicare, Medicaid, insurance and self-pay 336-832-8035 °[address] [place], [place] [address]  °[facility]  • Primary medical care °• Accepts Medicare, Medicaid, insurance and self-pay  [phone] °[address] [place], [place] [address]  °[facility] • For [place] residents between the ages of 18 and 64 who do not have any form of health insurance, Medicare, Medicaid, or VA benefits.  Services are provided free of charge to uninsured patients who fall within federal poverty guidelines.   °• Hours: Tuesdays and Thursdays, 4:15 - 8 pm 336-570-9800 °[address] °[place], [place] [address]  °[facility] ° ° ° • Primary medical care °• Dental care °• Nutritional counseling °• Pharmacy °• Accepts Medicaid, Medicare, most insurance. °• Fees are adjusted based on ability to pay.   [phone] °[facility] °[address] [place], [place] ° °336-570-3739 °[facility] °[address] °[place], [place] ° °336-562-3311 °[facility] °[place], [place] ° °[phone] °[facility], [address] °, [place] ° °336-506-0631 °[facility] °[address] °[place], [place]  °[facility] • Women’s health and family planning 336-373-0678 °[address] °, [place]  °[place] [facility] • Child care °• Emergency assistance for housing and utilities °• Food stamps °• Medicaid 336-683-8000 °[address], °[place], [place] [address] °  °[facility]  ° • Ages 18 and older °• Hours: Mondays and Thursdays, 7:00 am - 9:00 am Patients are seen on a first come, first served basis. [phone], ext. 123 °[address] °[place],   °[place]   [facility] • Child care °• Emergency assistance for housing and utilities °• Food stamps °• Medicaid 336-342-1394 °[address] °[place], [place] [address]  °[facility] • Medication assistance °• Rental assistance °• Food pantry °• Medication assistance °• Housing assistance °• Emergency food distribution °• Utility assistance  336-222-5529 °[address] °[place], [place] ° °336-235-0368  °[address] °[place], [place] [address] °Hours: Tuesdays and Thursdays from 9am - 12 noon by appointment only ° °336-349-4923 °[address] °[place], [place] [address]  °[facility] ° • Accepts private insurance, Medicare, and Medicaid. °• Payment is based on a sliding scale for those without insurance. °• Hours: Mondays, Tuesdays and Thursdays, 8:30 am - 5:30 pm.   [phone] °[address] °[place], [place]  °[facility] [address]  ° • Accepts private insurance, Medicare, and Medicaid. °• Payment is based on a sliding scale for those without insurance. [phone] °[address] °[place], [place]  °[facility] [address] • Accepts private insurance, Medicare, and Medicaid. °• Payment is based on a sliding scale for those without insurance [phone] °[address], [place], [place]  °[facility] [address] • Accepts private insurance, Medicare, and Medicaid. °• Payment is based on a sliding scale for those without insurance. [phone] °[address] °[place], [place]  °[facility] [address] • Accepts private insurance, Medicare, and Medicaid. °• Payment is based on a sliding scale for those without insurance. [phone], ext. 2221 °[address] °[place], [place].    °[facility] • Maternity care. °• Accepts Medicaid and self-pay. [phone] °[address] °, [place]  ° °

## 2015-08-16 NOTE — ED Provider Notes (Signed)
CSN: [id number]     Arrival date & time 08/15/15  2125 History   First MD Initiated Contact with Patient 08/15/15 2347     Chief Complaint  Patient presents with  . Insect Bite     (Consider location/radiation/quality/duration/timing/severity/associated sxs/prior Treatment) HPI  Blood pressure 137/76, pulse 51, temperature 97.8 F (36.6 C), temperature source Oral, resp. rate 18, SpO2 100 %.  Keith Contreras is a 20 y.o. male asthma, who presents with draining RLE wound. It started approximately 2 weeks ago as a small red lesion on the lateral aspect of his right calf, which he attributed to a bug bite. The lesion increased in size with visible pus, and began draining spontaneously the day of arrival. He denies pain at the site, fever or chills, no history of diabetes.  Past Medical History  Diagnosis Date  . Asthma    History reviewed. No pertinent past surgical history. Family History  Problem Relation Age of Onset  . Diabetes Father    Social History  Substance Use Topics  . Smoking status: Former Games developermoker  . Smokeless tobacco: Never Used  . Alcohol Use: No    Review of Systems  10 systems reviewed and found to be negative, except as noted in the HPI.   Allergies  Review of patient's allergies indicates no known allergies.  Home Medications   Prior to Admission medications   Medication Sig Start Date End Date Taking? Authorizing Provider  acetaminophen (TYLENOL) 500 MG tablet Take 1,000 mg by mouth every 6 (six) hours as needed for mild pain, moderate pain or headache.   Yes Historical Provider, MD  albuterol (PROVENTIL HFA;VENTOLIN HFA) 108 (90 BASE) MCG/ACT inhaler Inhale 2 puffs into the lungs every 4 (four) hours as needed for wheezing or shortness of breath. 12/13/14  Yes Lacona N Rumley, DO  ibuprofen (ADVIL,MOTRIN) 200 MG tablet Take 400 mg by mouth daily as needed (pain).   Yes Historical Provider, MD  acetaminophen (TYLENOL) 500 MG tablet Take 2 tablets (1,000  mg total) by mouth every 6 (six) hours as needed. Patient not taking: Reported on 08/15/2015 06/09/15   Arby BarretteMarcy Pfeiffer, MD  predniSONE (DELTASONE) 20 MG tablet 3 tabs po day one, then 2 tabs daily x 4 days Patient not taking: Reported on 08/15/2015 06/09/15   Arby BarretteMarcy Pfeiffer, MD  promethazine-dextromethorphan (PROMETHAZINE-DM) 6.25-15 MG/5ML syrup Take 5 mLs by mouth 4 (four) times daily as needed for cough. Patient not taking: Reported on 08/15/2015 06/09/15   Arby BarretteMarcy Pfeiffer, MD   BP 137/76 mmHg  Pulse 51  Temp(Src) 97.8 F (36.6 C) (Oral)  Resp 18  SpO2 100% Physical Exam  Constitutional: He is oriented to person, place, and time. He appears well-developed and well-nourished. No distress.  HENT:  Head: Normocephalic.  Eyes: Conjunctivae and EOM are normal.  Cardiovascular: Normal rate.   Pulmonary/Chest: Effort normal. No stridor.  Musculoskeletal: Normal range of motion. He exhibits tenderness.  2 cm actively draining abscess to right lateral calf, trace surrounding cellulitis  Neurological: He is alert and oriented to person, place, and time.  Psychiatric: He has a normal mood and affect.  Nursing note and vitals reviewed.   ED Course  .Marland Kitchen.Incision and Drainage Date/Time: 08/16/2015 1:34 AM Performed by: Wynetta EmeryPISCIOTTA, Saidi Santacroce Authorized by: Wynetta EmeryPISCIOTTA, Kolin Erdahl Consent: Verbal consent obtained. Risks and benefits: risks, benefits and alternatives were discussed Consent given by: patient Patient identity confirmed: verbally with patient Type: abscess Body area: lower extremity Location details: right leg Anesthesia: local infiltration Local anesthetic: lidocaine 1%  without epinephrine Patient sedated: no Scalpel size: 11 Incision type: single straight Incision depth: dermal Drainage: purulent Wound treatment: wound left open Packing material: none Patient tolerance: Patient tolerated the procedure well with no immediate complications   (including critical care time) Labs  Review Labs Reviewed - No data to display  Imaging Review No results found. I have personally reviewed and evaluated these images and lab results as part of my medical decision-making.   EKG Interpretation None      MDM   Final diagnoses:  Abscess and cellulitis   Filed Vitals:   08/15/15 2218  BP: 137/76  Pulse: 51  Temp: 97.8 F (36.6 C)  TempSrc: Oral  Resp: 18  SpO2: 100%    Medications  lidocaine-EPINEPHrine (XYLOCAINE-EPINEPHrine) 1 %-1:200000 (PF) injection 20 mL (20 mLs Other Given 08/16/15 0055)    Keith Contreras is 20 y.o. male presenting withActively draining abscess to right lower extremity, no signs of systemic infection. I and D performed, counseled patient on wound care and return precautions.   Evaluation does not show pathology that would require ongoing emergent intervention or inpatient treatment. Pt is hemodynamically stable and mentating appropriately. Discussed findings and plan with patient/guardian, who agrees with care plan. All questions answered. Return precautions discussed and outpatient follow up given.       Wynetta Emery, PA-C 08/16/15 1610  Jerelyn Scott, MD 08/17/15 1911

## 2015-12-01 ENCOUNTER — Encounter (HOSPITAL_COMMUNITY): Payer: Self-pay | Admitting: Emergency Medicine

## 2015-12-01 ENCOUNTER — Emergency Department (HOSPITAL_COMMUNITY)
Admission: EM | Admit: 2015-12-01 | Discharge: 2015-12-01 | Disposition: A | Discharge status: Home / Self Care | Payer: Commercial Managed Care - HMO | Attending: Emergency Medicine | Admitting: Emergency Medicine

## 2015-12-01 DIAGNOSIS — M542 Cervicalgia: Secondary | ICD-10-CM | POA: Insufficient documentation

## 2015-12-01 DIAGNOSIS — J45909 Unspecified asthma, uncomplicated: Secondary | ICD-10-CM | POA: Diagnosis not present

## 2015-12-01 DIAGNOSIS — K029 Dental caries, unspecified: Secondary | ICD-10-CM | POA: Diagnosis not present

## 2015-12-01 DIAGNOSIS — K0889 Other specified disorders of teeth and supporting structures: Secondary | ICD-10-CM | POA: Insufficient documentation

## 2015-12-01 DIAGNOSIS — Z87891 Personal history of nicotine dependence: Secondary | ICD-10-CM | POA: Diagnosis not present

## 2015-12-01 MED ORDER — PENICILLIN V POTASSIUM 500 MG PO TABS: 500.0000 mg | ORAL_TABLET | Freq: Three times a day (TID) | ORAL | 0 refills | Status: DC

## 2015-12-01 MED ORDER — NAPROXEN 500 MG PO TABS: 500.0000 mg | ORAL_TABLET | Freq: Two times a day (BID) | ORAL | 0 refills | Status: DC

## 2015-12-01 NOTE — ED Provider Notes (Signed)
[facility]-EMERGENCY DEPT Provider Note   CSN: [id number] Arrival date & time: 12/01/15  1125  First Provider Contact:   First MD Initiated Contact with Patient 12/01/15 1220     By signing my name below, I, Keith Contreras, attest that this documentation has been prepared under the direction and in the presence of [employer], PA-C.  Electronically Signed: Octavia Heir, ED Scribe. 12/01/15. 12:26 PM.    History   Chief Complaint Chief Complaint  Patient presents with  . Dental Pain  . Neck Pain    The history is provided by the patient. No language interpreter was used.   HPI Comments: Keith Contreras is a 20 y.o. male who presents to the Emergency Department complaining of sudden onset, gradual worsening, moderate, left upper dental pain onset last night. He reports associated left upper dental swelling and left sided neck pain. Pt has not seen a dentist for his pain. He denies neck stiffness.  Past Medical History:  Diagnosis Date  . Asthma     There are no active problems to display for this patient.   History reviewed. No pertinent surgical history.     Home Medications    Prior to Admission medications   Medication Sig Start Date End Date Taking? Authorizing Provider  acetaminophen (TYLENOL) 500 MG tablet Take 1,000 mg by mouth every 6 (six) hours as needed for mild pain, moderate pain or headache.    Historical Provider, MD  acetaminophen (TYLENOL) 500 MG tablet Take 2 tablets (1,000 mg total) by mouth every 6 (six) hours as needed. Patient not taking: Reported on 08/15/2015 06/09/15   Arby Barrette, MD  albuterol (PROVENTIL HFA;VENTOLIN HFA) 108 (90 BASE) MCG/ACT inhaler Inhale 2 puffs into the lungs every 4 (four) hours as needed for wheezing or shortness of breath. 12/13/14   Hartley N Rumley, DO  ibuprofen (ADVIL,MOTRIN) 200 MG tablet Take 400 mg by mouth daily as needed (pain).    Historical Provider, MD  predniSONE (DELTASONE) 20 MG tablet 3 tabs po day one, then 2  tabs daily x 4 days Patient not taking: Reported on 08/15/2015 06/09/15   Arby Barrette, MD  promethazine-dextromethorphan (PROMETHAZINE-DM) 6.25-15 MG/5ML syrup Take 5 mLs by mouth 4 (four) times daily as needed for cough. Patient not taking: Reported on 08/15/2015 06/09/15   Arby Barrette, MD    Family History Family History  Problem Relation Age of Onset  . Diabetes Father     Social History Social History  Substance Use Topics  . Smoking status: Former Games developer  . Smokeless tobacco: Never Used  . Alcohol use No     Allergies   Review of patient's allergies indicates no known allergies.   Review of Systems Review of Systems  Constitutional: Negative for fever.  HENT: Positive for dental problem and facial swelling. Negative for ear pain, sore throat and trouble swallowing.   Respiratory: Negative for shortness of breath and stridor.   Musculoskeletal: Positive for neck pain. Negative for neck stiffness.  Skin: Negative for color change.  Neurological: Negative for headaches.     Physical Exam Updated Vital Signs BP 132/71 (BP Location: Right Arm)   Pulse (!) 53   Temp 98.8 F (37.1 C) (Oral)   Resp 16   Ht 6\' 1"  (1.854 m)   Wt 157 lb (71.2 kg)   SpO2 98%   BMI 20.71 kg/m   Physical Exam  Constitutional: He appears well-developed and well-nourished.  HENT:  Head: Normocephalic and atraumatic.  Right Ear: Tympanic membrane,  external ear and ear canal normal.  Left Ear: Tympanic membrane, external ear and ear canal normal.  Nose: Nose normal.  Mouth/Throat: Uvula is midline, oropharynx is clear and moist and mucous membranes are normal. No trismus in the jaw. Abnormal dentition. Dental caries present. No dental abscesses or uvula swelling. No tonsillar abscesses.  No gross abscess. Lower wisdom teeth have erupted. Uppers have not. I do not see any teeth erupting.   Eyes: Pupils are equal, round, and reactive to light.  Neck: Normal range of motion. Neck supple.    No neck swelling or Lugwig's angina  Neurological: He is alert.  Skin: Skin is warm and dry.  Psychiatric: He has a normal mood and affect.  Nursing note and vitals reviewed.    ED Treatments / Results  DIAGNOSTIC STUDIES: Oxygen Saturation is 98% on RA, normal by my interpretation.  COORDINATION OF CARE:  12:22 PM Discussed treatment plan which includes antibiotics and follow up with a dentist with pt at bedside and pt agreed to plan.  Labs (all labs ordered are listed, but only abnormal results are displayed) Labs Reviewed - No data to display  EKG  EKG Interpretation None       Radiology No results found.  Procedures Procedures (including critical care time)  Medications Ordered in ED Medications - No data to display         Initial Impression / Assessment and Plan / ED Course  I have reviewed the triage vital signs and the nursing notes.  Pertinent labs & imaging results that were available during my care of the patient were reviewed by me and considered in my medical decision making (see chart for details).  Clinical Course   Vital signs reviewed and are as follows: BP 132/71 (BP Location: Right Arm)   Pulse (!) 53   Temp 98.8 F (37.1 C) (Oral)   Resp 16   Ht  (1.854 m)   Wt 71.2 kg   SpO2 98%   BMI 20.71 kg/m   Patient counseled to take prescribed medications as directed, return with worsening facial or neck swelling, and to follow-up with their dentist as soon as possible.  Final Clinical Impressions(s) / ED Diagnoses   Final diagnoses:  Pain, dental   Patient with toothache. No fever. Exam unconcerning for Ludwig's angina or other deep tissue infection in neck.   Treat with abx, NSAIDs, dental follow-up.   New Prescriptions New Prescriptions   NAPROXEN (NAPROSYN) 500 MG TABLET    Take 1 tablet (500 mg total) by mouth 2 (two) times daily.   PENICILLIN V POTASSIUM (VEETID) 500 MG TABLET    Take 1 tablet (500 mg total) by mouth 3  (three) times daily.     Renne Crigler, PA-C 12/01/15 1246    Linwood Dibbles, MD 12/05/15 [phone]

## 2015-12-01 NOTE — ED Notes (Signed)
Bed: WA26 Expected date:  Expected time:  Means of arrival:  Comments: 

## 2015-12-01 NOTE — ED Notes (Signed)
ED PA at bedside

## 2015-12-01 NOTE — ED Triage Notes (Signed)
Pt reports R side dental pain for the past 2 days. Pt thinks he has a hole in his tooth, but could not see his dentist today. Pt also reports neck pain.

## 2016-04-02 ENCOUNTER — Encounter (HOSPITAL_COMMUNITY): Payer: Self-pay | Admitting: Emergency Medicine

## 2016-04-02 ENCOUNTER — Emergency Department (HOSPITAL_COMMUNITY)
Admission: EM | Admit: 2016-04-02 | Discharge: 2016-04-02 | Disposition: A | Discharge status: Home / Self Care | Payer: Commercial Managed Care - HMO | Attending: Emergency Medicine | Admitting: Emergency Medicine

## 2016-04-02 DIAGNOSIS — M545 Low back pain, unspecified: Secondary | ICD-10-CM

## 2016-04-02 DIAGNOSIS — J4521 Mild intermittent asthma with (acute) exacerbation: Secondary | ICD-10-CM | POA: Diagnosis not present

## 2016-04-02 DIAGNOSIS — R0602 Shortness of breath: Secondary | ICD-10-CM | POA: Diagnosis present

## 2016-04-02 DIAGNOSIS — Z87891 Personal history of nicotine dependence: Secondary | ICD-10-CM | POA: Diagnosis not present

## 2016-04-02 MED ORDER — ALBUTEROL SULFATE (2.5 MG/3ML) 0.083% IN NEBU
5.0000 mg | INHALATION_SOLUTION | Freq: Once | RESPIRATORY_TRACT | Status: AC
  Administered 2016-04-02: 5 mg via RESPIRATORY_TRACT

## 2016-04-02 MED ORDER — PREDNISONE 10 MG PO TABS: 40.0000 mg | ORAL_TABLET | Freq: Every day | ORAL | 0 refills | Status: AC

## 2016-04-02 MED ORDER — ALBUTEROL SULFATE HFA 108 (90 BASE) MCG/ACT IN AERS
4.0000 | INHALATION_SPRAY | Freq: Once | RESPIRATORY_TRACT | Status: AC
  Administered 2016-04-02: 4 via RESPIRATORY_TRACT
  Filled 2016-04-02: qty 6.7

## 2016-04-02 MED ORDER — CYCLOBENZAPRINE HCL 10 MG PO TABS: 10.0000 mg | ORAL_TABLET | Freq: Two times a day (BID) | ORAL | 0 refills | Status: DC | PRN

## 2016-04-02 MED ORDER — ALBUTEROL SULFATE (2.5 MG/3ML) 0.083% IN NEBU
INHALATION_SOLUTION | RESPIRATORY_TRACT | Status: AC
  Filled 2016-04-02: qty 6

## 2016-04-02 MED ORDER — PREDNISONE 20 MG PO TABS
60.0000 mg | ORAL_TABLET | Freq: Once | ORAL | Status: AC
  Administered 2016-04-02: 60 mg via ORAL
  Filled 2016-04-02: qty 3

## 2016-04-02 NOTE — ED Notes (Signed)
Pt does not have PCP, gave information sheet on [facility].

## 2016-04-02 NOTE — ED Triage Notes (Signed)
Pt c/o asthma flair up x3 days, states his inhaler isn't working. Pt has wheezing in lung fields. Pt in NAD. Resp e/u

## 2016-04-02 NOTE — ED Provider Notes (Signed)
[facility]-EMERGENCY DEPT Provider Note   CSN: [id number] Arrival date & time: 04/02/16  1529  By signing my name below, I, Freida Busmaniana Omoyeni, attest that this documentation has been prepared under the direction and in the presence of Alvira MondayErin Jamie-Lee Galdamez, MD . Electronically Signed: Freida Busmaniana Omoyeni, Scribe. 04/02/2016. 3:50 PM.  History   Chief Complaint Chief Complaint  Patient presents with  . Asthma   The history is provided by the patient. No language interpreter was used.    HPI Comments:  Keith Contreras is a 20 y.o. male who presents to the Emergency Department complaining of gradual onset, SOB x 2 days with associated cough. His SOB is worse with exertion.  He notes symptoms today are similar to past asthma exacerbations. He has been admitted in the past for his asthma but denies h/o ICU admission or intubation. Pt states he has about 2 exacerbations a year. He does not have any home nebulizer treatments but has been using his inhaler with minimal relief; notes inhaler is almost finished. Pt reports associated chest tightness consistent with his asthma, wheezing, and nasal congestion. No fever or CP.  Rates dyspnea as a 4/10. Worse with ambulation.  Past Medical History:  Diagnosis Date  . Asthma     There are no active problems to display for this patient.   History reviewed. No pertinent surgical history.     Home Medications    Prior to Admission medications   Medication Sig Start Date End Date Taking? Authorizing Provider  albuterol (PROVENTIL HFA;VENTOLIN HFA) 108 (90 BASE) MCG/ACT inhaler Inhale 2 puffs into the lungs every 4 (four) hours as needed for wheezing or shortness of breath. 12/13/14   [place] N Rumley, DO  cyclobenzaprine (FLEXERIL) 10 MG tablet Take 1 tablet (10 mg total) by mouth 2 (two) times daily as needed for muscle spasms. 04/02/16   Alvira MondayErin Rande Roylance, MD  ibuprofen (ADVIL,MOTRIN) 200 MG tablet Take 400 mg by mouth daily as needed (pain).    Historical Provider, MD    naproxen (NAPROSYN) 500 MG tablet Take 1 tablet (500 mg total) by mouth 2 (two) times daily. 12/01/15   Renne CriglerJoshua Geiple, PA-C  penicillin v potassium (VEETID) 500 MG tablet Take 1 tablet (500 mg total) by mouth 3 (three) times daily. 12/01/15   Renne CriglerJoshua Geiple, PA-C  predniSONE (DELTASONE) 10 MG tablet Take 4 tablets (40 mg total) by mouth daily. 04/02/16 04/06/16  Alvira MondayErin Adalae Baysinger, MD  predniSONE (DELTASONE) 10 MG tablet Take 4 tablets (40 mg total) by mouth daily. 04/02/16 04/06/16  Alvira MondayErin Grant Swager, MD    Family History Family History  Problem Relation Age of Onset  . Diabetes Father     Social History Social History  Substance Use Topics  . Smoking status: Former Games developermoker  . Smokeless tobacco: Never Used  . Alcohol use No     Allergies   Patient has no known allergies.   Review of Systems Review of Systems  Constitutional: Negative for chills and fever.  HENT: Positive for congestion and ear pain. Negative for sore throat.   Eyes: Negative for visual disturbance.  Respiratory: Positive for cough, chest tightness, shortness of breath and wheezing.   Cardiovascular: Negative for chest pain.  Gastrointestinal: Negative for abdominal pain.  Genitourinary: Negative for difficulty urinating.  Musculoskeletal: Negative for back pain and neck stiffness.  Skin: Negative for rash.  Neurological: Negative for syncope and headaches.  All other systems reviewed and are negative.    Physical Exam Updated Vital Signs BP 127/60 (BP Location:  Right Arm)   Pulse 97   Temp 98.5 F (36.9 C) (Oral)   Resp 18   Ht 6\' 2"  (1.88 m)   Wt 155 lb (70.3 kg)   SpO2 97%   BMI 19.90 kg/m   Physical Exam  Constitutional: He is oriented to person, place, and time. He appears well-developed and well-nourished. No distress.  HENT:  Head: Normocephalic and atraumatic.  Right Ear: Tympanic membrane normal.  Left Ear: Tympanic membrane normal.  Mouth/Throat: No oropharyngeal exudate.  Eyes: Conjunctivae  and EOM are normal.  Neck: Normal range of motion.  Cardiovascular: Normal rate, regular rhythm, normal heart sounds and intact distal pulses.  Exam reveals no gallop and no friction rub.   No murmur heard. Pulmonary/Chest: Effort normal. No respiratory distress. He has wheezes. He has no rales.  Diffuse inspiratory and expiratory wheezes  Abdominal: Soft. He exhibits no distension. There is no tenderness. There is no guarding.  Musculoskeletal: He exhibits no edema.  Neurological: He is alert and oriented to person, place, and time.  Skin: Skin is warm and dry. He is not diaphoretic.  Nursing note and vitals reviewed.    ED Treatments / Results  DIAGNOSTIC STUDIES:  Oxygen Saturation is 95% on RA, adequate by my interpretation.    COORDINATION OF CARE:  3:48 PM Discussed treatment plan with pt at bedside and pt agreed to plan.  Labs (all labs ordered are listed, but only abnormal results are displayed) Labs Reviewed - No data to display  EKG  EKG Interpretation None       Radiology No results found.  Procedures Procedures (including critical care time)  Medications Ordered in ED Medications  albuterol (PROVENTIL) (2.5 MG/3ML) 0.083% nebulizer solution 5 mg (5 mg Nebulization Given 04/02/16 1538)  predniSONE (DELTASONE) tablet 60 mg (60 mg Oral Given 04/02/16 1627)  albuterol (PROVENTIL HFA;VENTOLIN HFA) 108 (90 Base) MCG/ACT inhaler 4 puff (4 puffs Inhalation Given 04/02/16 1627)     Initial Impression / Assessment and Plan / ED Course  I have reviewed the triage vital signs and the nursing notes.  Pertinent labs & imaging results that were available during my care of the patient were reviewed by me and considered in my medical decision making (see chart for details).  Clinical Course    20 year old male with a history of asthma presents with concern for cough, dyspnea and wheezing. Have low suspicion for pneumonia as patient has symmetric breath sounds, is  afebrile, nontachypneic or hypoxic. Symptoms are consistent with patient's asthma exacerbations, likely secondary to viral URI. He is given albuterol nebulizer and MDI in the emergency department with improvement. He is given 60 mg of prednisone, and discharged a prescription for 40 mg for 4 days. Also reports lower right sided back pain, no urinary symptoms, no red flags, suspect msk pain. Given rx for flexeril. Patient discharged in stable condition with understanding of reasons to return.   Final Clinical Impressions(s) / ED Diagnoses   Final diagnoses:  Mild intermittent asthma with exacerbation  Acute right-sided low back pain without sciatica    New Prescriptions Discharge Medication List as of 04/02/2016  4:30 PM    START taking these medications   Details  cyclobenzaprine (FLEXERIL) 10 MG tablet Take 1 tablet (10 mg total) by mouth 2 (two) times daily as needed for muscle spasms., Starting Tue 04/02/2016, Print    !! predniSONE (DELTASONE) 10 MG tablet Take 4 tablets (40 mg total) by mouth daily., Starting Tue 04/02/2016, Until Sat 04/06/2016,  Print    !! predniSONE (DELTASONE) 10 MG tablet Take 4 tablets (40 mg total) by mouth daily., Starting Tue 04/02/2016, Until Sat 04/06/2016, Print     !! - Potential duplicate medications found. Please discuss with provider.     I personally performed the services described in this documentation, which was scribed in my presence. The recorded information has been reviewed and is accurate.     Alvira MondayErin Evertte Sones, MD 04/02/16 [phone]

## 2016-04-06 ENCOUNTER — Encounter (HOSPITAL_COMMUNITY): Payer: Self-pay | Admitting: Oncology

## 2016-04-06 ENCOUNTER — Emergency Department (HOSPITAL_COMMUNITY)
Admission: EM | Admit: 2016-04-06 | Discharge: 2016-04-06 | Disposition: A | Discharge status: Home / Self Care | Payer: Commercial Managed Care - HMO | Attending: Emergency Medicine | Admitting: Emergency Medicine

## 2016-04-06 DIAGNOSIS — R509 Fever, unspecified: Secondary | ICD-10-CM | POA: Diagnosis present

## 2016-04-06 DIAGNOSIS — R6889 Other general symptoms and signs: Secondary | ICD-10-CM

## 2016-04-06 DIAGNOSIS — J45909 Unspecified asthma, uncomplicated: Secondary | ICD-10-CM | POA: Diagnosis not present

## 2016-04-06 DIAGNOSIS — Z87891 Personal history of nicotine dependence: Secondary | ICD-10-CM | POA: Diagnosis not present

## 2016-04-06 DIAGNOSIS — R05 Cough: Secondary | ICD-10-CM | POA: Insufficient documentation

## 2016-04-06 MED ORDER — OSELTAMIVIR PHOSPHATE 75 MG PO CAPS: 75.0000 mg | ORAL_CAPSULE | Freq: Two times a day (BID) | ORAL | 0 refills | Status: DC

## 2016-04-06 NOTE — ED Triage Notes (Signed)
Pt c/o flu like sx x 2 days.  C/o body aches, chills, cough and HA.  States that he started to feel really bad today.  Pt has not tried any OTC medication as he stated, "I don't know what to take."  Pt is A&O x 4.  Rates pain 7/10.

## 2016-04-06 NOTE — ED Provider Notes (Signed)
[facility]-EMERGENCY DEPT Provider Note   CSN: [id number] Arrival date & time: 04/06/16  2006     History   Chief Complaint Chief Complaint  Patient presents with  . Flu Like Sx    HPI Keith Contreras is a 20 y.o. male.  Patient presents to the emergency department with chief complaint of fever, chills, body aches, cough, and sore throat. He did not get a flu shot this year. He has a history of asthma, and was recently treated for an asthma exacerbation. He states that this is been feeling much better. He has not had any difficulty breathing today. States that the symptoms did come on today. There are no modifying factors. There are no other associated symptoms.   The history is provided by the patient. No language interpreter was used.    Past Medical History:  Diagnosis Date  . Asthma     There are no active problems to display for this patient.   History reviewed. No pertinent surgical history.     Home Medications    Prior to Admission medications   Medication Sig Start Date End Date Taking? Authorizing Provider  albuterol (PROVENTIL HFA;VENTOLIN HFA) 108 (90 BASE) MCG/ACT inhaler Inhale 2 puffs into the lungs every 4 (four) hours as needed for wheezing or shortness of breath. 12/13/14   [place] N Rumley, DO  cyclobenzaprine (FLEXERIL) 10 MG tablet Take 1 tablet (10 mg total) by mouth 2 (two) times daily as needed for muscle spasms. 04/02/16   Alvira MondayErin Schlossman, MD  ibuprofen (ADVIL,MOTRIN) 200 MG tablet Take 400 mg by mouth daily as needed (pain).    Historical Provider, MD  naproxen (NAPROSYN) 500 MG tablet Take 1 tablet (500 mg total) by mouth 2 (two) times daily. 12/01/15   Renne CriglerJoshua Geiple, PA-C  penicillin v potassium (VEETID) 500 MG tablet Take 1 tablet (500 mg total) by mouth 3 (three) times daily. 12/01/15   Renne CriglerJoshua Geiple, PA-C  predniSONE (DELTASONE) 10 MG tablet Take 4 tablets (40 mg total) by mouth daily. 04/02/16 04/06/16  Alvira MondayErin Schlossman, MD  predniSONE (DELTASONE) 10 MG  tablet Take 4 tablets (40 mg total) by mouth daily. 04/02/16 04/06/16  Alvira MondayErin Schlossman, MD    Family History Family History  Problem Relation Age of Onset  . Diabetes Father     Social History Social History  Substance Use Topics  . Smoking status: Former Games developermoker  . Smokeless tobacco: Never Used  . Alcohol use No     Allergies   Patient has no known allergies.   Review of Systems Review of Systems  Constitutional: Positive for chills and fever.  HENT: Positive for postnasal drip, rhinorrhea, sinus pressure, sneezing and sore throat.   Respiratory: Positive for cough. Negative for shortness of breath.   Cardiovascular: Negative for chest pain.  Gastrointestinal: Negative for abdominal pain, constipation, diarrhea, nausea and vomiting.  Genitourinary: Negative for dysuria.     Physical Exam Updated Vital Signs BP 129/73 (BP Location: Left Arm)   Pulse 60   Temp 98.5 F (36.9 C) (Oral)   Resp 14   Ht 6\' 2"  (1.88 m)   Wt 70.3 kg   SpO2 96%   BMI 19.90 kg/m   Physical Exam Physical Exam  Constitutional: Pt  is oriented to person, place, and time. Appears well-developed and well-nourished. No distress.  HENT:  Head: Normocephalic and atraumatic.  Right Ear: Tympanic membrane, external ear and ear canal normal.  Left Ear: Tympanic membrane, external ear and ear canal normal.  Nose:  Mucosal edema and mild rhinorrhea present. No epistaxis. Right sinus exhibits no maxillary sinus tenderness and no frontal sinus tenderness. Left sinus exhibits no maxillary sinus tenderness and no frontal sinus tenderness.  Mouth/Throat: Uvula is midline and mucous membranes are normal. Mucous membranes are not pale and not cyanotic. No oropharyngeal exudate, posterior oropharyngeal edema, posterior oropharyngeal erythema or tonsillar abscesses.  Eyes: Conjunctivae are normal. Pupils are equal, round, and reactive to light.  Neck: Normal range of motion and full passive range of motion  without pain.  Cardiovascular: Normal rate and intact distal pulses.   Pulmonary/Chest: Effort normal and breath sounds normal. No stridor.  Clear and equal breath sounds without focal wheezes, rhonchi, rales  Abdominal: Soft. Bowel sounds are normal. There is no tenderness.  Musculoskeletal: Normal range of motion.  Lymphadenopathy:    Pthas no cervical adenopathy.  Neurological: Pt is alert and oriented to person, place, and time.  Skin: Skin is warm and dry. No rash noted. Pt is not diaphoretic.  Psychiatric: Normal mood and affect.  Nursing note and vitals reviewed.    ED Treatments / Results  Labs (all labs ordered are listed, but only abnormal results are displayed) Labs Reviewed - No data to display  EKG  EKG Interpretation None       Radiology No results found.  Procedures Procedures (including critical care time)  Medications Ordered in ED Medications - No data to display   Initial Impression / Assessment and Plan / ED Course  I have reviewed the triage vital signs and the nursing notes.  Pertinent labs & imaging results that were available during my care of the patient were reviewed by me and considered in my medical decision making (see chart for details).  Clinical Course     Patient with flu like symptoms. Do not get a flu shot. Patient does have a history of asthma. He has within the 2 day window for treatment with Tamiflu. I will give him a prescription of this, and recommend additional supportive care and conservative treatments. Patient understands agrees the plan. He understands to return if his symptoms worsen.  Final Clinical Impressions(s) / ED Diagnoses   Final diagnoses:  Flu-like symptoms    New Prescriptions New Prescriptions   No medications on file     Roxy HorsemanRobert Jarriel Papillion, PA-C 04/06/16 2116    Melene Planan Floyd, DO 04/07/16 0005

## 2016-04-28 ENCOUNTER — Encounter (HOSPITAL_COMMUNITY): Payer: Self-pay | Admitting: Emergency Medicine

## 2016-04-28 ENCOUNTER — Emergency Department (HOSPITAL_COMMUNITY)
Admission: EM | Admit: 2016-04-28 | Discharge: 2016-04-28 | Disposition: A | Discharge status: Home / Self Care | Payer: Commercial Managed Care - HMO | Attending: Emergency Medicine | Admitting: Emergency Medicine

## 2016-04-28 DIAGNOSIS — J45909 Unspecified asthma, uncomplicated: Secondary | ICD-10-CM | POA: Diagnosis not present

## 2016-04-28 DIAGNOSIS — Z87891 Personal history of nicotine dependence: Secondary | ICD-10-CM | POA: Diagnosis not present

## 2016-04-28 DIAGNOSIS — Z79899 Other long term (current) drug therapy: Secondary | ICD-10-CM | POA: Diagnosis not present

## 2016-04-28 DIAGNOSIS — K0889 Other specified disorders of teeth and supporting structures: Secondary | ICD-10-CM

## 2016-04-28 MED ORDER — PENICILLIN V POTASSIUM 500 MG PO TABS: 500.0000 mg | ORAL_TABLET | Freq: Four times a day (QID) | ORAL | 0 refills | Status: DC

## 2016-04-28 MED ORDER — NAPROXEN 500 MG PO TABS: 500.0000 mg | ORAL_TABLET | Freq: Two times a day (BID) | ORAL | 0 refills | Status: DC

## 2016-04-28 NOTE — ED Provider Notes (Signed)
[facility]-EMERGENCY DEPT Provider Note   CSN: [id number] Arrival date & time: 04/28/16  [id number]     History   Chief Complaint Chief Complaint  Patient presents with  . Dental Pain    HPI Keith Contreras is a 20 y.o. male.  HPI   Pt presents with recurrence of left lower dental pain.  The pain began last night, radiates into his left head and left jaw, is constant, aching, worse with palpation.  This is the same tooth that was treated earlier in the month - he finished the antibiotics but was getting his medicaid papers together and therefore did not go to the dentist.  Denies fevers, facial swelling, sore throat, difficulty swallowing or breathing.     Past Medical History:  Diagnosis Date  . Asthma     There are no active problems to display for this patient.   History reviewed. No pertinent surgical history.     Home Medications    Prior to Admission medications   Medication Sig Start Date End Date Taking? Authorizing Provider  albuterol (PROVENTIL HFA;VENTOLIN HFA) 108 (90 BASE) MCG/ACT inhaler Inhale 2 puffs into the lungs every 4 (four) hours as needed for wheezing or shortness of breath. 12/13/14   [place] N Rumley, DO  cyclobenzaprine (FLEXERIL) 10 MG tablet Take 1 tablet (10 mg total) by mouth 2 (two) times daily as needed for muscle spasms. 04/02/16   Alvira MondayErin Schlossman, MD  ibuprofen (ADVIL,MOTRIN) 200 MG tablet Take 400 mg by mouth daily as needed (pain).    Historical Provider, MD  naproxen (NAPROSYN) 500 MG tablet Take 1 tablet (500 mg total) by mouth 2 (two) times daily. 04/28/16   Trixie DredgeEmily Anjel Pardo, PA-C  oseltamivir (TAMIFLU) 75 MG capsule Take 1 capsule (75 mg total) by mouth every 12 (twelve) hours. 04/06/16   Roxy Horsemanobert Browning, PA-C  penicillin v potassium (VEETID) 500 MG tablet Take 1 tablet (500 mg total) by mouth 4 (four) times daily. 04/28/16   Trixie DredgeEmily Nathalya Wolanski, PA-C    Family History Family History  Problem Relation Age of Onset  . Diabetes Father     Social  History Social History  Substance Use Topics  . Smoking status: Former Games developermoker  . Smokeless tobacco: Never Used  . Alcohol use No     Allergies   Patient has no known allergies.   Review of Systems Review of Systems  Constitutional: Negative for activity change, appetite change, chills and fever.  HENT: Positive for dental problem. Negative for facial swelling, sore throat and trouble swallowing.   Respiratory: Negative for shortness of breath and stridor.   Skin: Negative for color change.  Allergic/Immunologic: Negative for immunocompromised state.  Psychiatric/Behavioral: Negative for self-injury.     Physical Exam Updated Vital Signs BP 141/78 (BP Location: Right Arm)   Pulse (!) 51   Temp 97.5 F (36.4 C) (Oral)   Resp 16   SpO2 100%   Physical Exam  Constitutional: He appears well-developed and well-nourished. No distress.  HENT:  Head: Normocephalic and atraumatic.  Mouth/Throat: Uvula is midline and oropharynx is clear and moist. Mucous membranes are not dry. No uvula swelling. No oropharyngeal exudate, posterior oropharyngeal edema, posterior oropharyngeal erythema or tonsillar abscesses.  Left lower first molar tender to percussion.  Gingiva below tooth is tender to palpation.   No obvious abscess.  No facial swelling.    Neck: Normal range of motion. Neck supple.  Cardiovascular: Normal rate.   Pulmonary/Chest: Effort normal and breath sounds normal. No stridor.  Lymphadenopathy:  He has no cervical adenopathy.  Neurological: He is alert.  Skin: He is not diaphoretic.  Nursing note and vitals reviewed.    ED Treatments / Results  Labs (all labs ordered are listed, but only abnormal results are displayed) Labs Reviewed - No data to display  EKG  EKG Interpretation None       Radiology No results found.  Procedures Procedures (including critical care time)  Medications Ordered in ED Medications - No data to display   Initial Impression  / Assessment and Plan / ED Course  I have reviewed the triage vital signs and the nursing notes.  Pertinent labs & imaging results that were available during my care of the patient were reviewed by me and considered in my medical decision making (see chart for details).  Clinical Course     Afebrile, nontoxic patient with new dental pain.  No obvious abscess.  No concerning findings on exam.  Doubt deep space head or neck infection.  Doubt Ludwig's angina.  D/C home with antibiotic, pain medication and dental follow up.  Discussed findings, treatment, and follow up  with patient.  Pt given return precautions.  Pt verbalizes understanding and agrees with plan.       Final Clinical Impressions(s) / ED Diagnoses   Final diagnoses:  Pain, dental    New Prescriptions New Prescriptions   NAPROXEN (NAPROSYN) 500 MG TABLET    Take 1 tablet (500 mg total) by mouth 2 (two) times daily.   PENICILLIN V POTASSIUM (VEETID) 500 MG TABLET    Take 1 tablet (500 mg total) by mouth 4 (four) times daily.     Trixie Dredgemily Harmonii Karle, PA-C 04/28/16 1006    Gwyneth SproutWhitney Plunkett, MD 04/29/16 [phone]

## 2016-04-28 NOTE — ED Notes (Signed)
Bed: WTR7 Expected date:  Expected time:  Means of arrival:  Comments: 

## 2016-04-28 NOTE — ED Triage Notes (Signed)
Pt c/o left lower molar dental pain onset 1 month ago, seen in ED and given abx, noticed abscess and increased pain last night with radiation to left upper head. No noticeable facial edema. No fevers, chills, n/v.

## 2016-04-28 NOTE — Discharge Instructions (Signed)
Read the information below.  Use the prescribed medication as directed.  Please discuss all new medications with your pharmacist.  You may return to the Emergency Department at any time for worsening condition or any new symptoms that concern you.   Please call the dentist listed above within 48 hours to schedule a close follow up appointment.  If you develop fevers, swelling in your face, difficulty swallowing or breathing, return to the ER immediately for a recheck.   °

## 2016-09-11 ENCOUNTER — Emergency Department (HOSPITAL_COMMUNITY)
Admission: EM | Admit: 2016-09-11 | Discharge: 2016-09-11 | Disposition: A | Discharge status: Home / Self Care | Payer: Commercial Managed Care - HMO | Attending: Emergency Medicine | Admitting: Emergency Medicine

## 2016-09-11 ENCOUNTER — Emergency Department (HOSPITAL_COMMUNITY): Payer: Commercial Managed Care - HMO

## 2016-09-11 ENCOUNTER — Encounter (HOSPITAL_COMMUNITY): Payer: Self-pay | Admitting: Emergency Medicine

## 2016-09-11 DIAGNOSIS — Z87891 Personal history of nicotine dependence: Secondary | ICD-10-CM | POA: Diagnosis not present

## 2016-09-11 DIAGNOSIS — Y9302 Activity, running: Secondary | ICD-10-CM | POA: Insufficient documentation

## 2016-09-11 DIAGNOSIS — S99922A Unspecified injury of left foot, initial encounter: Secondary | ICD-10-CM | POA: Diagnosis present

## 2016-09-11 DIAGNOSIS — X58XXXA Exposure to other specified factors, initial encounter: Secondary | ICD-10-CM | POA: Diagnosis not present

## 2016-09-11 DIAGNOSIS — Z79899 Other long term (current) drug therapy: Secondary | ICD-10-CM | POA: Diagnosis not present

## 2016-09-11 DIAGNOSIS — Y929 Unspecified place or not applicable: Secondary | ICD-10-CM | POA: Diagnosis not present

## 2016-09-11 DIAGNOSIS — J45909 Unspecified asthma, uncomplicated: Secondary | ICD-10-CM | POA: Insufficient documentation

## 2016-09-11 DIAGNOSIS — M722 Plantar fascial fibromatosis: Secondary | ICD-10-CM | POA: Diagnosis not present

## 2016-09-11 DIAGNOSIS — Y999 Unspecified external cause status: Secondary | ICD-10-CM | POA: Diagnosis not present

## 2016-09-11 MED ORDER — IBUPROFEN 800 MG PO TABS: 800.0000 mg | ORAL_TABLET | Freq: Three times a day (TID) | ORAL | 0 refills | Status: DC

## 2016-09-11 NOTE — ED Provider Notes (Signed)
[facility]-EMERGENCY DEPT Provider Note   CSN: [id number] Arrival date & time: 09/11/16  1012  By signing my name below, Keith Contreras, attest that this documentation has been prepared under the direction and in the presence of non physician practitioner, Graciella Freer.    Electronically Signed: Sandrea Hammond, Scribe 09/11/2016. 10:48 AM.   History   Chief Complaint Chief Complaint  Patient presents with  . Foot Pain    The history is provided by the patient. No language interpreter was used.    HPI Comments:  Keith Contreras is a 21 y.o. male who presents to the Emergency Department complaining of gradually worsening, constant left heel pain that began 4 days ago. He reports that pain initially began while playing baseball and running at full speed.  He denies twisting his ankle or any other injury. He describes pain as a "soreness" and states that it worsened last night, prompting ED visit. He states that pain is worse with ambulating and bearing weight, though he has been able to ambulate since the incident. He also states that pain is worse first thing in the morning and worsened with activity. He states that he took a one time dose of ibuprofen when pain initially began, but did not have relief of pain. He has not taken any ibuprofen since. He denies knee pain,  numbness, weakness, or tingling in extremities.   Past Medical History:  Diagnosis Date  . Asthma     There are no active problems to display for this patient.   History reviewed. No pertinent surgical history.     Home Medications    Prior to Admission medications   Medication Sig Start Date End Date Taking? Authorizing Provider  albuterol (PROVENTIL HFA;VENTOLIN HFA) 108 (90 BASE) MCG/ACT inhaler Inhale 2 puffs into the lungs every 4 (four) hours as needed for wheezing or shortness of breath. 12/13/14   Rumley, Lora Havens, DO  cyclobenzaprine (FLEXERIL) 10 MG tablet Take 1 tablet (10 mg total) by mouth 2 (two) times  daily as needed for muscle spasms. 04/02/16   Alvira Monday, MD  ibuprofen (ADVIL,MOTRIN) 800 MG tablet Take 1 tablet (800 mg total) by mouth 3 (three) times daily. 09/11/16   Maxwell Caul, PA-C  naproxen (NAPROSYN) 500 MG tablet Take 1 tablet (500 mg total) by mouth 2 (two) times daily. 04/28/16   Trixie Dredge, PA-C  oseltamivir (TAMIFLU) 75 MG capsule Take 1 capsule (75 mg total) by mouth every 12 (twelve) hours. 04/06/16   Roxy Horseman, PA-C  penicillin v potassium (VEETID) 500 MG tablet Take 1 tablet (500 mg total) by mouth 4 (four) times daily. 04/28/16   Trixie Dredge, PA-C    Family History Family History  Problem Relation Age of Onset  . Diabetes Father     Social History Social History  Substance Use Topics  . Smoking status: Former Games developer  . Smokeless tobacco: Never Used  . Alcohol use No     Allergies   Patient has no known allergies.   Review of Systems Review of Systems  Musculoskeletal: Positive for arthralgias and myalgias.  Neurological: Negative for weakness and numbness.     Physical Exam Updated Vital Signs BP 117/74 (BP Location: Left Arm)   Pulse (!) 52   Temp 98 F (36.7 C) (Oral)   Resp 16   SpO2 99%   Physical Exam  Constitutional: He appears well-developed and well-nourished.  Patient sitting comfortably on exam table eating cereal   HENT:  Head: Normocephalic and atraumatic.  Eyes: Conjunctivae and EOM are normal. Right eye exhibits no discharge. Left eye exhibits no discharge. No scleral icterus.  Cardiovascular:  2+ DP pulses bilaterally   Pulmonary/Chest: Effort normal.  Musculoskeletal: He exhibits no deformity.  Left ankle: no soft tissue swelling, ecchymosis or deformity. No TTP to medial or lateral malleolus.TTP to left heel and along the plantar fascia. Dorsiflexion/Plantarflexion of left foot intact fully. No abnormalities of right foot.  Neurological: He is alert.  Reflex Scores:      Achilles reflexes are 2+ on the  right side and 2+ on the left side. Skin: Skin is warm and dry.  Psychiatric: He has a normal mood and affect. His speech is normal and behavior is normal.  Nursing note and vitals reviewed.    ED Treatments / Results  DIAGNOSTIC STUDIES:  Oxygen Saturation is 99% on room air, normal, by my interpretation.    COORDINATION OF CARE:  10:37 AM Discussed treatment plan with pt at bedside and pt agreed to plan. Treatment plan includes Ibuprofen.  Labs (all labs ordered are listed, but only abnormal results are displayed) Labs Reviewed - No data to display  EKG  EKG Interpretation None       Radiology Dg Foot Complete Left  Result Date: 09/11/2016 CLINICAL DATA:  Pain after running EXAM: LEFT FOOT - COMPLETE 3+ VIEW COMPARISON:  None. FINDINGS: Frontal, oblique, and lateral views were obtained. There is no fracture or dislocation. Joint spaces appear normal. No erosive change. IMPRESSION: No fracture or dislocation.  No appreciable arthropathy. Electronically Signed   By: Bretta BangWilliam  Woodruff III M.D.   On: 09/11/2016 10:38    Procedures Procedures (including critical care time)  Medications Ordered in ED Medications - No data to display   Initial Impression / Assessment and Plan / ED Course  I have reviewed the triage vital signs and the nursing notes.  Pertinent labs & imaging results that were available during my care of the patient were reviewed by me and considered in my medical decision making (see chart for details).     21 year old male who presents with left heel pain. Consider plantar fasciitis versus sprain. Low suspicion for fracture given history/physical exam and mechanism of injury. History/physical exam are not concerning for a Achilles tendon rupture or tear. Will obtain x-ray given tenderness palpation of the left calcaneus to evaluate for any occult fracture.  X-ray reviewed. Negative for any fracture or dislocation. Symptoms likely result of plantar  fasciitis that is worsened by subtherapeutic ibuprofen use. Discussed results with patient. Provided him with instructions for stretching the plantar fascia. Orthopedic referral given no improvement in pain in the next 2 weeks. Strict return precautions discussed. Patient expresses understanding and agreement to plan.    Final Clinical Impressions(s) / ED Diagnoses   Final diagnoses:  Plantar fasciitis of left foot    New Prescriptions New Prescriptions   IBUPROFEN (ADVIL,MOTRIN) 800 MG TABLET    Take 1 tablet (800 mg total) by mouth 3 (three) times daily.   I personally performed the services described in this documentation, which was scribed in my presence. The recorded information has been reviewed and is accurate.     Maxwell CaulLayden, Lindsey A, PA-C 09/11/16 1100    Tilden Fossaees, Elizabeth, MD 09/11/16 1318

## 2016-09-11 NOTE — ED Triage Notes (Signed)
Pt c/o L heel pain that worsened today. Pt states he felt soreness after running on Saturday in baseball game. Pain at rest and with activity. Pt has not tried any OTC treatment.

## 2016-09-11 NOTE — Discharge Instructions (Addendum)
You can take ibuprofen as instructed for pain.  As we discussed using 2 different stretches to help with relieving the pain. Try freezing a water bottle and then rolling her foot over the bottle once it is present.  If there is no improvement in symptoms in the next 2 weeks she can follow-up with her for an orthopedic doctor for further evaluation.  Return the emergency Department for any worsening of pain, fever, redness/swelling of the foot, numbness or weakness of the foot or any other worsening or concerning symptoms.

## 2016-09-11 NOTE — ED Notes (Signed)
PT DISCHARGED. INSTRUCTIONS AND PRESCRIPTION GIVEN. AAOX4. PT IN NO APPARENT DISTRESS WITH MILD PAIN. THE OPPORTUNITY TO ASK QUESTIONS WAS PROVIDED. 

## 2017-01-24 ENCOUNTER — Emergency Department (HOSPITAL_COMMUNITY): Payer: 59

## 2017-01-24 ENCOUNTER — Emergency Department (HOSPITAL_COMMUNITY): Payer: 59 | Admitting: Anesthesiology

## 2017-01-24 ENCOUNTER — Encounter (HOSPITAL_COMMUNITY): Payer: Self-pay | Admitting: *Deleted

## 2017-01-24 ENCOUNTER — Ambulatory Visit (HOSPITAL_COMMUNITY)
Admission: EM | Admit: 2017-01-24 | Discharge: 2017-01-24 | Disposition: A | Discharge status: Home / Self Care | Payer: 59 | Attending: Emergency Medicine | Admitting: Emergency Medicine

## 2017-01-24 ENCOUNTER — Encounter (HOSPITAL_COMMUNITY)
Admission: EM | Disposition: A | Discharge status: Home / Self Care | Payer: Self-pay | Source: Home / Self Care | Attending: Emergency Medicine

## 2017-01-24 DIAGNOSIS — N433 Hydrocele, unspecified: Secondary | ICD-10-CM | POA: Insufficient documentation

## 2017-01-24 DIAGNOSIS — N50819 Testicular pain, unspecified: Secondary | ICD-10-CM | POA: Diagnosis present

## 2017-01-24 DIAGNOSIS — N4400 Torsion of testis, unspecified: Secondary | ICD-10-CM | POA: Insufficient documentation

## 2017-01-24 DIAGNOSIS — Z79899 Other long term (current) drug therapy: Secondary | ICD-10-CM | POA: Diagnosis not present

## 2017-01-24 DIAGNOSIS — Z87891 Personal history of nicotine dependence: Secondary | ICD-10-CM | POA: Insufficient documentation

## 2017-01-24 DIAGNOSIS — J45909 Unspecified asthma, uncomplicated: Secondary | ICD-10-CM | POA: Diagnosis not present

## 2017-01-24 DIAGNOSIS — N50812 Left testicular pain: Secondary | ICD-10-CM

## 2017-01-24 DIAGNOSIS — R9431 Abnormal electrocardiogram [ECG] [EKG]: Secondary | ICD-10-CM | POA: Insufficient documentation

## 2017-01-24 DIAGNOSIS — R001 Bradycardia, unspecified: Secondary | ICD-10-CM | POA: Diagnosis not present

## 2017-01-24 DIAGNOSIS — Z833 Family history of diabetes mellitus: Secondary | ICD-10-CM | POA: Insufficient documentation

## 2017-01-24 HISTORY — PX: TESTICULAR EXPLORATION: SHX5145

## 2017-01-24 LAB — COMPREHENSIVE METABOLIC PANEL
ALBUMIN: 4.1 g/dL (ref 3.5–5.0)
ALK PHOS: 67 U/L (ref 38–126)
ALT: 19 U/L (ref 17–63)
ANION GAP: 9 (ref 5–15)
AST: 29 U/L (ref 15–41)
BILIRUBIN TOTAL: 0.7 mg/dL (ref 0.3–1.2)
BUN: 10 mg/dL (ref 6–20)
CALCIUM: 9.1 mg/dL (ref 8.9–10.3)
CO2: 29 mmol/L (ref 22–32)
CREATININE: 1.39 mg/dL — AB (ref 0.61–1.24)
Chloride: 102 mmol/L (ref 101–111)
GFR calc Af Amer: >60 mL/min (ref >60)
GFR calc non Af Amer: >60 mL/min (ref >60)
GLUCOSE: 146 mg/dL — AB (ref 65–99)
Potassium: 3.6 mmol/L (ref 3.5–5.1)
Sodium: 140 mmol/L (ref 135–145)
TOTAL PROTEIN: 6.8 g/dL (ref 6.5–8.1)

## 2017-01-24 LAB — CBC
HCT: 45.4 % (ref 39.0–52.0)
HEMOGLOBIN: 15.6 g/dL (ref 13.0–17.0)
MCH: 31.2 pg (ref 26.0–34.0)
MCHC: 34.4 g/dL (ref 30.0–36.0)
MCV: 90.8 fL (ref 78.0–100.0)
PLATELETS: 194 10*3/uL (ref 150–400)
RBC: 5 MIL/uL (ref 4.22–5.81)
RDW: 11.4 % — AB (ref 11.5–15.5)
WBC: 7.5 10*3/uL (ref 4.0–10.5)

## 2017-01-24 LAB — LIPASE, BLOOD: Lipase: 28 U/L (ref 11–51)

## 2017-01-24 SURGERY — EXPLORATION, TESTICLE
Anesthesia: General | Site: Scrotum | Laterality: Bilateral

## 2017-01-24 MED ORDER — DEXAMETHASONE SODIUM PHOSPHATE 10 MG/ML IJ SOLN
INTRAMUSCULAR | Status: DC | PRN
  Administered 2017-01-24: 10 mg via INTRAVENOUS

## 2017-01-24 MED ORDER — TRAMADOL HCL 50 MG PO TABS: 50.0000 mg | ORAL_TABLET | Freq: Four times a day (QID) | ORAL | 0 refills | Status: AC | PRN

## 2017-01-24 MED ORDER — FENTANYL CITRATE (PF) 250 MCG/5ML IJ SOLN
INTRAMUSCULAR | Status: AC
  Filled 2017-01-24: qty 5

## 2017-01-24 MED ORDER — FENTANYL CITRATE (PF) 250 MCG/5ML IJ SOLN
INTRAMUSCULAR | Status: DC | PRN
  Administered 2017-01-24: 100 ug via INTRAVENOUS

## 2017-01-24 MED ORDER — EPHEDRINE SULFATE-NACL 50-0.9 MG/10ML-% IV SOSY
PREFILLED_SYRINGE | INTRAVENOUS | Status: DC | PRN
  Administered 2017-01-24: 200 mg via INTRAVENOUS

## 2017-01-24 MED ORDER — MIDAZOLAM HCL 2 MG/2ML IJ SOLN
INTRAMUSCULAR | Status: AC
  Filled 2017-01-24: qty 2

## 2017-01-24 MED ORDER — FENTANYL CITRATE (PF) 100 MCG/2ML IJ SOLN: 25.0000 ug | INTRAMUSCULAR | Status: DC | PRN

## 2017-01-24 MED ORDER — CLINDAMYCIN PHOSPHATE 900 MG/50ML IV SOLN
900.0000 mg | Freq: Once | INTRAVENOUS | Status: AC
  Administered 2017-01-24 (×2): 900 mg via INTRAVENOUS
  Filled 2017-01-24: qty 50

## 2017-01-24 MED ORDER — PROPOFOL 10 MG/ML IV BOLUS
INTRAVENOUS | Status: AC
  Filled 2017-01-24: qty 20

## 2017-01-24 MED ORDER — HYDROMORPHONE HCL 1 MG/ML IJ SOLN
1.0000 mg | Freq: Once | INTRAMUSCULAR | Status: AC
  Administered 2017-01-24: 1 mg via INTRAVENOUS
  Filled 2017-01-24: qty 1

## 2017-01-24 MED ORDER — BUPIVACAINE HCL (PF) 0.25 % IJ SOLN
INTRAMUSCULAR | Status: AC
  Filled 2017-01-24: qty 30

## 2017-01-24 MED ORDER — SENNOSIDES-DOCUSATE SODIUM 8.6-50 MG PO TABS: 1.0000 | ORAL_TABLET | Freq: Two times a day (BID) | ORAL | 0 refills | Status: DC

## 2017-01-24 MED ORDER — ROCURONIUM BROMIDE 10 MG/ML (PF) SYRINGE
PREFILLED_SYRINGE | INTRAVENOUS | Status: DC | PRN
  Administered 2017-01-24: 20 mg via INTRAVENOUS
  Administered 2017-01-24: 40 mg via INTRAVENOUS

## 2017-01-24 MED ORDER — ONDANSETRON HCL 4 MG/2ML IJ SOLN
4.0000 mg | Freq: Once | INTRAMUSCULAR | Status: AC
  Administered 2017-01-24: 4 mg via INTRAVENOUS
  Filled 2017-01-24: qty 2

## 2017-01-24 MED ORDER — PROMETHAZINE HCL 25 MG/ML IJ SOLN: 6.2500 mg | INTRAMUSCULAR | Status: DC | PRN

## 2017-01-24 MED ORDER — LACTATED RINGERS IV SOLN
INTRAVENOUS | Status: DC
  Administered 2017-01-24: 13:00:00 via INTRAVENOUS

## 2017-01-24 MED ORDER — MIDAZOLAM HCL 2 MG/2ML IJ SOLN
INTRAMUSCULAR | Status: DC | PRN
  Administered 2017-01-24: 2 mg via INTRAVENOUS

## 2017-01-24 MED ORDER — LIDOCAINE 2% (20 MG/ML) 5 ML SYRINGE
INTRAMUSCULAR | Status: DC | PRN
  Administered 2017-01-24: 60 mg via INTRAVENOUS

## 2017-01-24 MED ORDER — PROPOFOL 10 MG/ML IV BOLUS
INTRAVENOUS | Status: DC | PRN
  Administered 2017-01-24: 200 mg via INTRAVENOUS

## 2017-01-24 MED ORDER — ONDANSETRON HCL 4 MG/2ML IJ SOLN
INTRAMUSCULAR | Status: DC | PRN
  Administered 2017-01-24: 4 mg via INTRAVENOUS

## 2017-01-24 SURGICAL SUPPLY — 34 items
BAG URO CATCHER STRL LF (MISCELLANEOUS) IMPLANT
BLADE 10 SAFETY STRL DISP (BLADE) ×3 IMPLANT
BLADE SURG 15 STRL LF DISP TIS (BLADE) ×2 IMPLANT
BLADE SURG 15 STRL SS (BLADE) ×6
CANISTER SUCT 3000ML PPV (MISCELLANEOUS) ×3 IMPLANT
CONT SPEC 4OZ CLIKSEAL STRL BL (MISCELLANEOUS) ×1 IMPLANT
DECANTER SPIKE VIAL GLASS SM (MISCELLANEOUS) ×2 IMPLANT
DRAIN PENROSE 1/4X12 LTX STRL (WOUND CARE) ×2 IMPLANT
DRAPE LAPAROTOMY T 102X78X121 (DRAPES) ×3 IMPLANT
DRSG PAD ABDOMINAL 8X10 ST (GAUZE/BANDAGES/DRESSINGS) ×6 IMPLANT
ELECT REM PT RETURN 9FT ADLT (ELECTROSURGICAL) ×3
ELECTRODE REM PT RTRN 9FT ADLT (ELECTROSURGICAL) ×1 IMPLANT
GAUZE SPONGE 4X4 12PLY STRL (GAUZE/BANDAGES/DRESSINGS) ×3 IMPLANT
GLOVE SURG ORTHO 8.0 STRL STRW (GLOVE) ×3 IMPLANT
KIT BASIN OR (CUSTOM PROCEDURE TRAY) ×3 IMPLANT
KIT ROOM TURNOVER OR (KITS) ×3 IMPLANT
NEEDLE 22X1 1/2 (OR ONLY) (NEEDLE) ×2 IMPLANT
NS IRRIG 1000ML POUR BTL (IV SOLUTION) ×3 IMPLANT
PACK GENERAL/GYN (CUSTOM PROCEDURE TRAY) ×3 IMPLANT
PAD ABD 8X10 STRL (GAUZE/BANDAGES/DRESSINGS) ×2 IMPLANT
PAD ARMBOARD 7.5X6 YLW CONV (MISCELLANEOUS) ×6 IMPLANT
SOL PREP POV-IOD 4OZ 10% (MISCELLANEOUS) ×3 IMPLANT
SPONGE LAP 18X18 X RAY DECT (DISPOSABLE) IMPLANT
SUT CHROMIC 3 0 SH 27 (SUTURE) ×1 IMPLANT
SUT MNCRL AB 4-0 PS2 18 (SUTURE) ×2 IMPLANT
SUT PROLENE 4 0 PS 2 18 (SUTURE) ×1 IMPLANT
SUT VIC AB 3-0 SH 27 (SUTURE) ×3
SUT VIC AB 3-0 SH 27XBRD (SUTURE) IMPLANT
SUT VICRYL 4-0 PS2 18IN ABS (SUTURE) ×3 IMPLANT
SWAB COLLECTION DEVICE MRSA (MISCELLANEOUS) ×1 IMPLANT
SYR CONTROL 10ML LL (SYRINGE) ×2 IMPLANT
TOWEL OR 17X24 6PK STRL BLUE (TOWEL DISPOSABLE) ×3 IMPLANT
TOWEL OR 17X26 10 PK STRL BLUE (TOWEL DISPOSABLE) ×3 IMPLANT
WATER STERILE IRR 1000ML POUR (IV SOLUTION) ×3 IMPLANT

## 2017-01-24 NOTE — ED Notes (Signed)
Patient transported to Ultrasound 

## 2017-01-24 NOTE — Brief Op Note (Signed)
01/24/2017  1:32 PM  PATIENT:  Keith Contreras  21 y.o. male  PRE-OPERATIVE DIAGNOSIS:  Left Testicular Torsion  POST-OPERATIVE DIAGNOSIS:  Left Testicular Torsion  PROCEDURE:  Procedure(s): Scrotal Exporation with Left Testicular Torsion Bilateral Pexy. (Bilateral)  SURGEON:  Surgeon(s) and Role:    * Sebastian Ache, MD - Primary  PHYSICIAN ASSISTANT:   ASSISTANTS: none   ANESTHESIA:   local and general  EBL:  Total I/O In: -  Out: 5 [Blood:5]  BLOOD ADMINISTERED:none  DRAINS: Penrose drain in the dependant scrotum   LOCAL MEDICATIONS USED:  MARCAINE     SPECIMEN:  No Specimen  DISPOSITION OF SPECIMEN:  N/A  COUNTS:  YES  TOURNIQUET:  * No tourniquets in log *  DICTATION: .Other Dictation: Dictation Number [id number]  PLAN OF CARE: Discharge to home after PACU  PATIENT DISPOSITION:  PACU - hemodynamically stable.   Delay start of Pharmacological VTE agent (>24hrs) due to surgical blood loss or risk of bleeding: yes

## 2017-01-24 NOTE — Transfer of Care (Signed)
Immediate Anesthesia Transfer of Care Note  Patient: Keith Contreras  Procedure(s) Performed: Procedure(s): Scrotal Exporation with Left Testicular Torsion Bilateral Pexy. (Bilateral)  Patient Location: PACU  Anesthesia Type:General  Level of Consciousness: drowsy  Airway & Oxygen Therapy: Patient Spontanous Breathing and Patient connected to face mask oxygen  Post-op Assessment: Report given to RN and Post -op Vital signs reviewed and stable  Post vital signs: Reviewed and stable  Last Vitals:  Vitals:   01/24/17 0935 01/24/17 1015  BP: (!) 195/96 (!) 142/95  Pulse: 96 (!) 40  Resp: 18   Temp: 36.4 C   SpO2: 100% 100%    Last Pain:  Vitals:   01/24/17 1123  TempSrc:   PainSc: 1          Complications: No apparent anesthesia complications

## 2017-01-24 NOTE — Interval H&P Note (Signed)
History and Physical Interval Note:  01/24/2017 12:47 PM  Keith Contreras  has presented today for surgery, with the diagnosis of Left Testicular Torsion  The various methods of treatment have been discussed with the patient and family. After consideration of risks, benefits and other options for treatment, the patient has consented to  Procedure(s): Scrotal Exporation with Left Testicular Torsion Bilateral Pexy. (Bilateral) as a surgical intervention .  The patient's history has been reviewed, patient examined, no change in status, stable for surgery.  I have reviewed the patient's chart and labs.  Questions were answered to the patient's satisfaction.     [name]

## 2017-01-24 NOTE — ED Provider Notes (Addendum)
[facility]-EMERGENCY DEPT Provider Note   CSN: [id number] Arrival date & time: 01/24/17  0932     History   Chief Complaint Chief Complaint  Patient presents with  . Abdominal Pain  . Testicle Pain    HPI Keith Contreras is a 21 y.o. male.  Patient with no significant PMH presents with acute onset of L testicular and lower abdominal pain between 7 and 8am today. Denies injury. Associated with vomiting. No back pain, fever, chest pain, SOB, diarrhea, hematuria. No treatments PTA. The course is constant. Aggravating factors: palpation. Alleviating factors: none.        Past Medical History:  Diagnosis Date  . Asthma     There are no active problems to display for this patient.   History reviewed. No pertinent surgical history.     Home Medications    Prior to Admission medications   Medication Sig Start Date End Date Taking? Authorizing Provider  albuterol (PROVENTIL HFA;VENTOLIN HFA) 108 (90 BASE) MCG/ACT inhaler Inhale 2 puffs into the lungs every 4 (four) hours as needed for wheezing or shortness of breath. 12/13/14   Rumley, Lora Havens, DO  cyclobenzaprine (FLEXERIL) 10 MG tablet Take 1 tablet (10 mg total) by mouth 2 (two) times daily as needed for muscle spasms. 04/02/16   Alvira Monday, MD  ibuprofen (ADVIL,MOTRIN) 800 MG tablet Take 1 tablet (800 mg total) by mouth 3 (three) times daily. 09/11/16   Maxwell Caul, PA-C  naproxen (NAPROSYN) 500 MG tablet Take 1 tablet (500 mg total) by mouth 2 (two) times daily. 04/28/16   Trixie Dredge, PA-C  oseltamivir (TAMIFLU) 75 MG capsule Take 1 capsule (75 mg total) by mouth every 12 (twelve) hours. 04/06/16   Roxy Horseman, PA-C  penicillin v potassium (VEETID) 500 MG tablet Take 1 tablet (500 mg total) by mouth 4 (four) times daily. 04/28/16   Trixie Dredge, PA-C    Family History Family History  Problem Relation Age of Onset  . Diabetes Father     Social History Social History  Substance Use Topics  . Smoking  status: Former Games developer  . Smokeless tobacco: Never Used  . Alcohol use No     Allergies   Patient has no known allergies.   Review of Systems Review of Systems  Constitutional: Negative for fever.  HENT: Negative for rhinorrhea and sore throat.   Eyes: Negative for redness.  Respiratory: Negative for cough.   Cardiovascular: Negative for chest pain.  Gastrointestinal: Positive for abdominal pain, nausea and vomiting. Negative for diarrhea.  Genitourinary: Positive for testicular pain. Negative for dysuria, hematuria and scrotal swelling.  Musculoskeletal: Negative for myalgias.  Skin: Negative for rash.  Neurological: Negative for headaches.     Physical Exam Updated Vital Signs BP (!) 195/96 (BP Location: Left Arm)   Pulse 96   Temp 97.6 F (36.4 C) (Oral)   Resp 18   SpO2 100%   Physical Exam  Constitutional: He appears well-developed and well-nourished. He appears distressed (uncomfortable appearing).  HENT:  Head: Normocephalic and atraumatic.  Eyes: Conjunctivae are normal. Right eye exhibits no discharge. Left eye exhibits no discharge.  Neck: Normal range of motion. Neck supple.  Cardiovascular: Regular rhythm and normal heart sounds.  Bradycardia present.   Pulmonary/Chest: Effort normal and breath sounds normal.  Abdominal: Soft. There is no tenderness.  Genitourinary: Right testis shows no mass, no swelling and no tenderness. Left testis shows swelling (trace) and tenderness. Left testis shows no mass.     Neurological: He  is alert.  Skin: Skin is warm and dry.  Psychiatric: He has a normal mood and affect.  Nursing note and vitals reviewed.    ED Treatments / Results  Labs (all labs ordered are listed, but only abnormal results are displayed) Labs Reviewed  COMPREHENSIVE METABOLIC PANEL - Abnormal; Notable for the following:       Result Value   Glucose, Bld 146 (*)    Creatinine, Ser 1.39 (*)    All other components within normal limits  CBC -  Abnormal; Notable for the following:    RDW 11.4 (*)    All other components within normal limits  LIPASE, BLOOD  URINALYSIS, ROUTINE W REFLEX MICROSCOPIC    EKG  EKG Interpretation None       Radiology US Scrotum  Result Date: 01/24/2017 CLINICAL DATA:  Left testicular pain for several hours EXAM: SCROTAL ULTRASOUND DOPPLER ULTRASOUND OF THE TESTICLES TECHNIQUE: Complete ultrasound examination of the testicles, epididymis, and other scrotal structures was performed. Color and spectral Doppler ultrasound were also utilized to evaluate blood flow to the testicles. COMPARISON:  None. FINDINGS: Right testicle Measurements: 4.4 x 1.9 x 2.5 cm. No mass or microlithiasis visualized. Left testicle Measurements: 4.4 x 2.3 x 2.6 cm. No mass or microlithiasis visualized. Right epididymis:  Normal in size and appearance. Left epididymis:  Normal in size and appearance. Hydrocele:  Small left hydrocele is noted. Varicocele:  None visualized. Pulsed Doppler interrogation of both testes demonstrates normal waveforms within the right testicle. The left testicle demonstrates no significant internal flow consistent with torsion. IMPRESSION: Changes consistent with left testicular torsion. Small left hydrocele is noted as well. Normal appearing right testicle Critical Value/emergent results were called by telephone at the time of interpretation on 01/24/2017 at 11:07 am to Dr. Crista Curb , who verbally acknowledged these results. Electronically Signed   By: Alcide Clever M.D.   On: 01/24/2017 11:10   [place] Scrotom Doppler  Result Date: 01/24/2017 CLINICAL DATA:  Left testicular pain for several hours EXAM: SCROTAL ULTRASOUND DOPPLER ULTRASOUND OF THE TESTICLES TECHNIQUE: Complete ultrasound examination of the testicles, epididymis, and other scrotal structures was performed. Color and spectral Doppler ultrasound were also utilized to evaluate blood flow to the testicles. COMPARISON:  None. FINDINGS: Right testicle  Measurements: 4.4 x 1.9 x 2.5 cm. No mass or microlithiasis visualized. Left testicle Measurements: 4.4 x 2.3 x 2.6 cm. No mass or microlithiasis visualized. Right epididymis:  Normal in size and appearance. Left epididymis:  Normal in size and appearance. Hydrocele:  Small left hydrocele is noted. Varicocele:  None visualized. Pulsed Doppler interrogation of both testes demonstrates normal waveforms within the right testicle. The left testicle demonstrates no significant internal flow consistent with torsion. IMPRESSION: Changes consistent with left testicular torsion. Small left hydrocele is noted as well. Normal appearing right testicle Critical Value/emergent results were called by telephone at the time of interpretation on 01/24/2017 at 11:07 am to Dr. Crista Curb , who verbally acknowledged these results. Electronically Signed   By: Alcide Clever M.D.   On: 01/24/2017 11:10    Procedures Procedures (including critical care time)  Medications Ordered in ED Medications  HYDROmorphone (DILAUDID) injection 1 mg (1 mg Intravenous Given 01/24/17 1018)  ondansetron (ZOFRAN) injection 4 mg (4 mg Intravenous Given 01/24/17 1018)     Initial Impression / Assessment and Plan / ED Course  I have reviewed the triage vital signs and the nursing notes.  Pertinent labs & imaging results that were available during  my care of the patient were reviewed by me and considered in my medical decision making (see chart for details).     10:15 AM Patient seen immediately at request of RN. Pain medications ordered. Work-up initiated. Asked RN to facilitate [place] -- they have just come to get patient now.    Vital signs reviewed and are as follows: BP (!) 195/96 (BP Location: Left Arm)   Pulse 96   Temp 97.6 F (36.4 C) (Oral)   Resp 18   SpO2 100%   11:15 AM Dr. Verdie Mosher called with result -- + torsion. Call made to urology. Dr. Donnald Garre aware and has seen. Pain controlled.   11:19 AM Spoke with Dr. Berneice Heinrich. Patient will  be going to OR in next hr. Last oral intake midnight.   12:41 PM To OR.  CRITICAL CARE Performed by: Carolee Rota Total critical care time: 35 minutes Critical care time was exclusive of separately billable procedures and treating other patients. Critical care was necessary to treat or prevent imminent or life-threatening deterioration. Critical care was time spent personally by me on the following activities: development of treatment plan with patient and/or surrogate as well as nursing, discussions with consultants, evaluation of patient's response to treatment, examination of patient, obtaining history from patient or surrogate, ordering and performing treatments and interventions, ordering and review of laboratory studies, ordering and review of radiographic studies, pulse oximetry and re-evaluation of patient's condition.   Final Clinical Impressions(s) / ED Diagnoses   Final diagnoses:  Left testicular pain  Testicular pain  Testicular torsion   Admit testicular torsion.   New Prescriptions New Prescriptions   No medications on file     Desmond Dike 01/24/17 1120    Arby Barrette, MD 01/24/17 1215    Renne Crigler, PA-C 01/24/17 1241    Arby Barrette, MD 01/25/17 [phone]

## 2017-01-24 NOTE — Anesthesia Postprocedure Evaluation (Signed)
Anesthesia Post Note  Patient: Keith Contreras  Procedure(s) Performed: Procedure(s) (LRB): Scrotal Exporation with Left Testicular Torsion Bilateral Pexy. (Bilateral)     Patient location during evaluation: PACU Anesthesia Type: General Level of consciousness: awake and alert Pain management: pain level controlled Vital Signs Assessment: post-procedure vital signs reviewed and stable Respiratory status: spontaneous breathing, nonlabored ventilation and respiratory function stable Cardiovascular status: blood pressure returned to baseline and stable Postop Assessment: no apparent nausea or vomiting Anesthetic complications: no    Last Vitals:  Vitals:   01/24/17 1400 01/24/17 1402  BP:  121/72  Pulse: (!) 52 (!) 50  Resp: 12 11  Temp:    SpO2: 100% 100%    Last Pain:  Vitals:   01/24/17 1418  TempSrc:   PainSc: Asleep                 Beryle Lathe

## 2017-01-24 NOTE — H&P (Signed)
Keith Contreras is an 21 y.o. male.    Chief Complaint:  LEFT testicular torsion  HPI:   1 - LEFT testicular torsion - acute onset left scrotal pain approx 5 hours ago, unprovoked. No prior episodes. [place] in Con eER confirms likely left torsion with no bloodflow on left. Exam with tender high-riding left testicle, right side unremarkable.  Last meal 12 hours ago.   PMH sig for occasional asthma (usually when weather changes). No CV disease / blood thinners. No prior surgery.  Today "[name]" is seen for emergent evaluation of above.   Past Medical History:  Diagnosis Date  . Asthma     History reviewed. No pertinent surgical history.  Family History  Problem Relation Age of Onset  . Diabetes Father    Social History:  reports that he has quit smoking. He has never used smokeless tobacco. He reports that he does not drink alcohol or use drugs.  Allergies: No Known Allergies   (Not in a hospital admission)  Results for orders placed or performed during the hospital encounter of 01/24/17 (from the past 48 hour(s))  Lipase, blood     Status: None   Collection Time: 01/24/17  9:46 AM  Result Value Ref Range   Lipase 28 11 - 51 U/L  Comprehensive metabolic panel     Status: Abnormal   Collection Time: 01/24/17  9:46 AM  Result Value Ref Range   Sodium 140 135 - 145 mmol/L   Potassium 3.6 3.5 - 5.1 mmol/L   Chloride 102 101 - 111 mmol/L   CO2 29 22 - 32 mmol/L   Glucose, Bld 146 (H) 65 - 99 mg/dL   BUN 10 6 - 20 mg/dL   Creatinine, Ser 1.39 (H) 0.61 - 1.24 mg/dL   Calcium 9.1 8.9 - 10.3 mg/dL   Total Protein 6.8 6.5 - 8.1 g/dL   Albumin 4.1 3.5 - 5.0 g/dL   AST 29 15 - 41 U/L   ALT 19 17 - 63 U/L   Alkaline Phosphatase 67 38 - 126 U/L   Total Bilirubin 0.7 0.3 - 1.2 mg/dL   GFR calc non Af Amer >60 >60 mL/min   GFR calc Af Amer >60 >60 mL/min    Comment: (NOTE) The eGFR has been calculated using the CKD EPI equation. This calculation has not been validated in all  clinical situations. eGFR's persistently <60 mL/min signify possible Chronic Kidney Disease.    Anion gap 9 5 - 15  CBC     Status: Abnormal   Collection Time: 01/24/17  9:46 AM  Result Value Ref Range   WBC 7.5 4.0 - 10.5 K/uL   RBC 5.00 4.22 - 5.81 MIL/uL   Hemoglobin 15.6 13.0 - 17.0 g/dL   HCT 45.4 39.0 - 52.0 %   MCV 90.8 78.0 - 100.0 fL   MCH 31.2 26.0 - 34.0 pg   MCHC 34.4 30.0 - 36.0 g/dL   RDW 11.4 (L) 11.5 - 15.5 %   Platelets 194 150 - 400 K/uL   US Scrotum  Result Date: 01/24/2017 CLINICAL DATA:  Left testicular pain for several hours EXAM: SCROTAL ULTRASOUND DOPPLER ULTRASOUND OF THE TESTICLES TECHNIQUE: Complete ultrasound examination of the testicles, epididymis, and other scrotal structures was performed. Color and spectral Doppler ultrasound were also utilized to evaluate blood flow to the testicles. COMPARISON:  None. FINDINGS: Right testicle Measurements: 4.4 x 1.9 x 2.5 cm. No mass or microlithiasis visualized. Left testicle Measurements: 4.4 x 2.3 x 2.6 cm.  No mass or microlithiasis visualized. Right epididymis:  Normal in size and appearance. Left epididymis:  Normal in size and appearance. Hydrocele:  Small left hydrocele is noted. Varicocele:  None visualized. Pulsed Doppler interrogation of both testes demonstrates normal waveforms within the right testicle. The left testicle demonstrates no significant internal flow consistent with torsion. IMPRESSION: Changes consistent with left testicular torsion. Small left hydrocele is noted as well. Normal appearing right testicle Critical Value/emergent results were called by telephone at the time of interpretation on 01/24/2017 at 11:07 am to Dr. Brantley Stage , who verbally acknowledged these results. Electronically Signed   By: Inez Catalina M.D.   On: 01/24/2017 11:10   [place] Scrotom Doppler  Result Date: 01/24/2017 CLINICAL DATA:  Left testicular pain for several hours EXAM: SCROTAL ULTRASOUND DOPPLER ULTRASOUND OF THE TESTICLES  TECHNIQUE: Complete ultrasound examination of the testicles, epididymis, and other scrotal structures was performed. Color and spectral Doppler ultrasound were also utilized to evaluate blood flow to the testicles. COMPARISON:  None. FINDINGS: Right testicle Measurements: 4.4 x 1.9 x 2.5 cm. No mass or microlithiasis visualized. Left testicle Measurements: 4.4 x 2.3 x 2.6 cm. No mass or microlithiasis visualized. Right epididymis:  Normal in size and appearance. Left epididymis:  Normal in size and appearance. Hydrocele:  Small left hydrocele is noted. Varicocele:  None visualized. Pulsed Doppler interrogation of both testes demonstrates normal waveforms within the right testicle. The left testicle demonstrates no significant internal flow consistent with torsion. IMPRESSION: Changes consistent with left testicular torsion. Small left hydrocele is noted as well. Normal appearing right testicle Critical Value/emergent results were called by telephone at the time of interpretation on 01/24/2017 at 11:07 am to Dr. Brantley Stage , who verbally acknowledged these results. Electronically Signed   By: Inez Catalina M.D.   On: 01/24/2017 11:10    Review of Systems  Constitutional: Negative.  Negative for chills and fever.  HENT: Negative.   Eyes: Negative.   Respiratory: Negative.   Cardiovascular: Negative.   Gastrointestinal: Positive for nausea.  Genitourinary: Negative.  Negative for flank pain and urgency.  Musculoskeletal: Negative.   Skin: Negative.   Neurological: Negative.   Endo/Heme/Allergies: Negative.   Psychiatric/Behavioral: Negative.     Blood pressure (!) 142/95, pulse (!) 40, temperature 97.6 F (36.4 C), temperature source Oral, resp. rate 18, SpO2 100 %. Physical Exam  Constitutional: He appears well-developed.  Neck: Normal range of motion.  Cardiovascular: Normal rate.   Respiratory: Effort normal.  GI: Soft.  Genitourinary:  Genitourinary Comments: High riding, enlarged, firm,  tender left testis that is non-fixed. Unremarkable Rt testis / scrotum.   Musculoskeletal: Normal range of motion.  Neurological: He is alert.  Skin: Skin is warm.  Psychiatric: He has a normal mood and affect. His behavior is normal.     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 Rec emergent scrotal exploration with left testicular detorsion and bilateral orhcidopexy. He is in agreement and wants to proceed. Risks, benefits, alternatives, expected peri-op course with likely DC post-op with scrotal drain discussed.    Alexis Frock, MD 01/24/2017, 11:50 AM

## 2017-01-24 NOTE — Anesthesia Procedure Notes (Signed)
Procedure Name: Intubation Date/Time: 01/24/2017 1:04 PM Performed by: Teressa Lower Pre-anesthesia Checklist: Patient identified, Emergency Drugs available, Suction available and Patient being monitored Patient Re-evaluated:Patient Re-evaluated prior to induction Oxygen Delivery Method: Circle system utilized Preoxygenation: Pre-oxygenation with 100% oxygen Induction Type: IV induction Ventilation: Mask ventilation without difficulty and Oral airway inserted - appropriate to patient size Laryngoscope Size: Mac and 4 Grade View: Grade I Tube type: Oral Tube size: 7.5 mm Number of attempts: 1 Airway Equipment and Method: Stylet and Oral airway Placement Confirmation: ETT inserted through vocal cords under direct vision,  positive ETCO2 and breath sounds checked- equal and bilateral Secured at: 21 cm Tube secured with: Tape Dental Injury: Teeth and Oropharynx as per pre-operative assessment

## 2017-01-24 NOTE — Anesthesia Preprocedure Evaluation (Addendum)
Anesthesia Evaluation  Patient identified by MRN, date of birth, ID band Patient awake    Reviewed: Allergy & Precautions, NPO status , Patient's Chart, lab work & pertinent test results  Airway Mallampati: I  TM Distance: >3 FB Neck ROM: Full    Dental  (+) Dental Advisory Given, Teeth Intact   Pulmonary asthma , former smoker,    Pulmonary exam normal breath sounds clear to auscultation       Cardiovascular negative cardio ROS Normal cardiovascular exam Rhythm:Regular Rate:Normal     Neuro/Psych negative neurological ROS  negative psych ROS   GI/Hepatic negative GI ROS, (+)     substance abuse  marijuana use,   Endo/Other  negative endocrine ROS  Renal/GU negative Renal ROS  negative genitourinary   Musculoskeletal negative musculoskeletal ROS (+)   Abdominal   Peds  Hematology negative hematology ROS (+)   Anesthesia Other Findings   Reproductive/Obstetrics                            Anesthesia Physical Anesthesia Plan  ASA: II and emergent  Anesthesia Plan: General   Post-op Pain Management:    Induction: Intravenous  PONV Risk Score and Plan: 3 and Ondansetron, Dexamethasone, Midazolam and Treatment may vary due to age or medical condition  Airway Management Planned: Oral ETT  Additional Equipment: None  Intra-op Plan:   Post-operative Plan: Extubation in OR  Informed Consent: I have reviewed the patients History and Physical, chart, labs and discussed the procedure including the risks, benefits and alternatives for the proposed anesthesia with the patient or authorized representative who has indicated his/her understanding and acceptance.   Dental advisory given  Plan Discussed with: CRNA  Anesthesia Plan Comments:        Anesthesia Quick Evaluation

## 2017-01-24 NOTE — ED Triage Notes (Signed)
Pt states he woke up and urinated and then started having left testicle pain, left sided abdominal pain, and vomited one time.  Discussed with Dr. Verdie Mosher and ordered US of scrotum

## 2017-01-24 NOTE — Discharge Instructions (Signed)
1 - You may have some small bloody drainage from drain area for few days, this is normal. You may shower tomorrow, but no tub bathing or sexual stimulation x 1 week.   2 - Call MD or go to ER for fever >102, severe pain / nausea / vomiting not relieved by medications, or acute change in medical status

## 2017-01-25 ENCOUNTER — Encounter (HOSPITAL_COMMUNITY): Payer: Self-pay | Admitting: Urology

## 2017-01-27 NOTE — Op Note (Signed)
NAMEJOBANI, SABADO              ACCOUNT NO.:  [id number]  MEDICAL RECORD NO.:  [id number]  LOCATION:                                 FACILITY:  PHYSICIAN:  Sebastian Ache, MD          DATE OF BIRTH:  DATE OF PROCEDURE:  01/24/2017                              OPERATIVE REPORT   PREOPERATIVE DIAGNOSIS:  Left testicular torsion.  POSTOPERATIVE DIAGNOSIS:  Left testicular torsion.  PROCEDURES: 1. Scrotal exploration with left testicular detorsion and bilateral     pexy. 2. Intraoperative Doppler ultrasound and interpretation.  ESTIMATED BLOOD LOSS:  Nil.  COMPLICATION:  None.  SPECIMEN:  None.  FINDINGS: 1. Left testicular torsion of three revolutions. 2. Return of triphasic arterial blood flow following detorsion. 3. Unremarkable right hemiscrotum. 4. Successful ablation of appendix testis bilaterally. 5. Successful bilateral orchidopexy with lateral sulcus lateral and     resolution of torsion following.  DRAINS:  Penrose drain to dependent scrotal wound drainage.  INDICATION:  Mr. Spano is a very pleasant 21 year old young man who had acute onset of left testicular pain approximately 5 hours before evaluation.  Presented to the [facility] Emergency Room where his presentation was concerning for possible torsion.  Doppler ultrasound confirmed no testicular flow on the left.  Emergent consultation was sought.  He was evaluated and clinical picture was consistent with left testicular torsion, recommended to have emergent scrotal exploration with left detorsion, bilateral pexy, wished to proceed.  Informed consent was obtained and placed in the medical record.  PROCEDURE IN DETAIL:  The patient being Pearletha Alfred, was verified. Procedure being left testicular detorsion and bilateral orchiopexy was confirmed.  Procedure was carried out.  Time-out was performed. Intravenous antibiotics were administered.  General endotracheal anesthesia was introduced.  The patient  was placed in supine position. Sterile field was created by prepping and draping the patient's scrotum after clipper shaving using iodine.  Next, an incision was made along the median raphae an into the area of the left scrotal compartment.  The left testicle was delivered in the operative field, it was quite dusky appearing.  It was indeed torsed.  It was de-torsed manually for three revolutions, which resulted in complete resolution.  There was immediate color change with early pinking of the testicle, this was clearly optimistic sign.  Testicle was wrapped in warm saline gauze and set aside.  Next, through the same incision, the right scrotal compartment was entered and the right testicle was delivered in the operative field. It was visibly normal, no evidence of torsion.  The appendix testis was ablated.  The right testicle was pexed by two small 4-0 Prolene sutures to the mesorchium testis first to the lateral aspect of the testicle and lateral leaflet of the scrotum and then inferiorly taking exquisite care to avoid testicular perforation, which did not occur and to avoid skin perforation, which did not occur.  The left testicle was once again taken out of saline gauze, again, there was good color change.  It was interrogated with Doppler ultrasound.  Doppler ultrasound intraoperatively revealed return of triphasic blood flow to the antimesenteric border of the testicle.  This was excellent  prognostic sign confirming resolution of acute vascular insufficiency. Left appendix testis was ablated and as per the right side, left testicle was pexed both inferiorly and laterally taking care to avoid skin or testicular perforation, which did not occur.  Next, the Penrose drain was brought through a deep tendon counterincision across both scrotal compartments, anchored in place with the nylon stitch.  Dartos was reapproximated with running Vicryl.  The skin was reapproximated with running  Monocryl.  Hemostasis was excellent.  Next, 10 mL of 0.25% Marcaine was instilled along the incision site and scrotal drain site, and procedure was terminated after dressing of ABDs and mesh panties were applied.  The patient tolerated the procedure well.  There were no immediate periprocedural complications.  The patient was taken to the postanesthesia care unit in stable condition.    ______________________________ Sebastian Ache, MD   ______________________________ Sebastian Ache, MD    TM/MEDQ  D:  01/24/2017  T:  01/24/2017  Job:  [id number]

## 2017-07-28 ENCOUNTER — Other Ambulatory Visit: Payer: Self-pay

## 2017-07-28 ENCOUNTER — Emergency Department (HOSPITAL_COMMUNITY)
Admission: EM | Admit: 2017-07-28 | Discharge: 2017-07-28 | Disposition: A | Discharge status: Home / Self Care | Payer: 59 | Attending: Emergency Medicine | Admitting: Emergency Medicine

## 2017-07-28 ENCOUNTER — Encounter (HOSPITAL_COMMUNITY): Payer: Self-pay

## 2017-07-28 DIAGNOSIS — R05 Cough: Secondary | ICD-10-CM | POA: Diagnosis present

## 2017-07-28 DIAGNOSIS — Z5321 Procedure and treatment not carried out due to patient leaving prior to being seen by health care provider: Secondary | ICD-10-CM | POA: Insufficient documentation

## 2017-07-28 DIAGNOSIS — K0889 Other specified disorders of teeth and supporting structures: Secondary | ICD-10-CM | POA: Diagnosis not present

## 2017-07-28 NOTE — ED Notes (Signed)
Called Pt in lobby to be roomed, no response in lobby x2. 

## 2017-07-28 NOTE — ED Notes (Signed)
Triage notes from 1810 to 1821 charted on the wrong patient. Disregard notes from 1810 to [id number]

## 2017-07-28 NOTE — ED Triage Notes (Signed)
Patient  C/o a productive cough with green /yellow sputum x 2 days.Left lower dental pain

## 2017-07-28 NOTE — ED Notes (Signed)
Called Pt in lobby to be roomed, no response in lobby x1. 

## 2017-07-28 NOTE — ED Notes (Signed)
Called Pt in lobby for vital recheck, no response in lobby x3. 

## 2017-07-30 NOTE — ED Notes (Signed)
07/30/2017, Attempted Follow-up call. Pt. Answered and hung up immediately.  

## 2017-11-04 IMAGING — DX DG CHEST 2V
2 series · 2 of 2 positions shown · non-contrast
Comparison: 06/22/2013.

CLINICAL DATA: Cough, shortness of breath, fever and fatigue for
the past 2 days.

EXAM:
CHEST  2 VIEW

[chest pa]
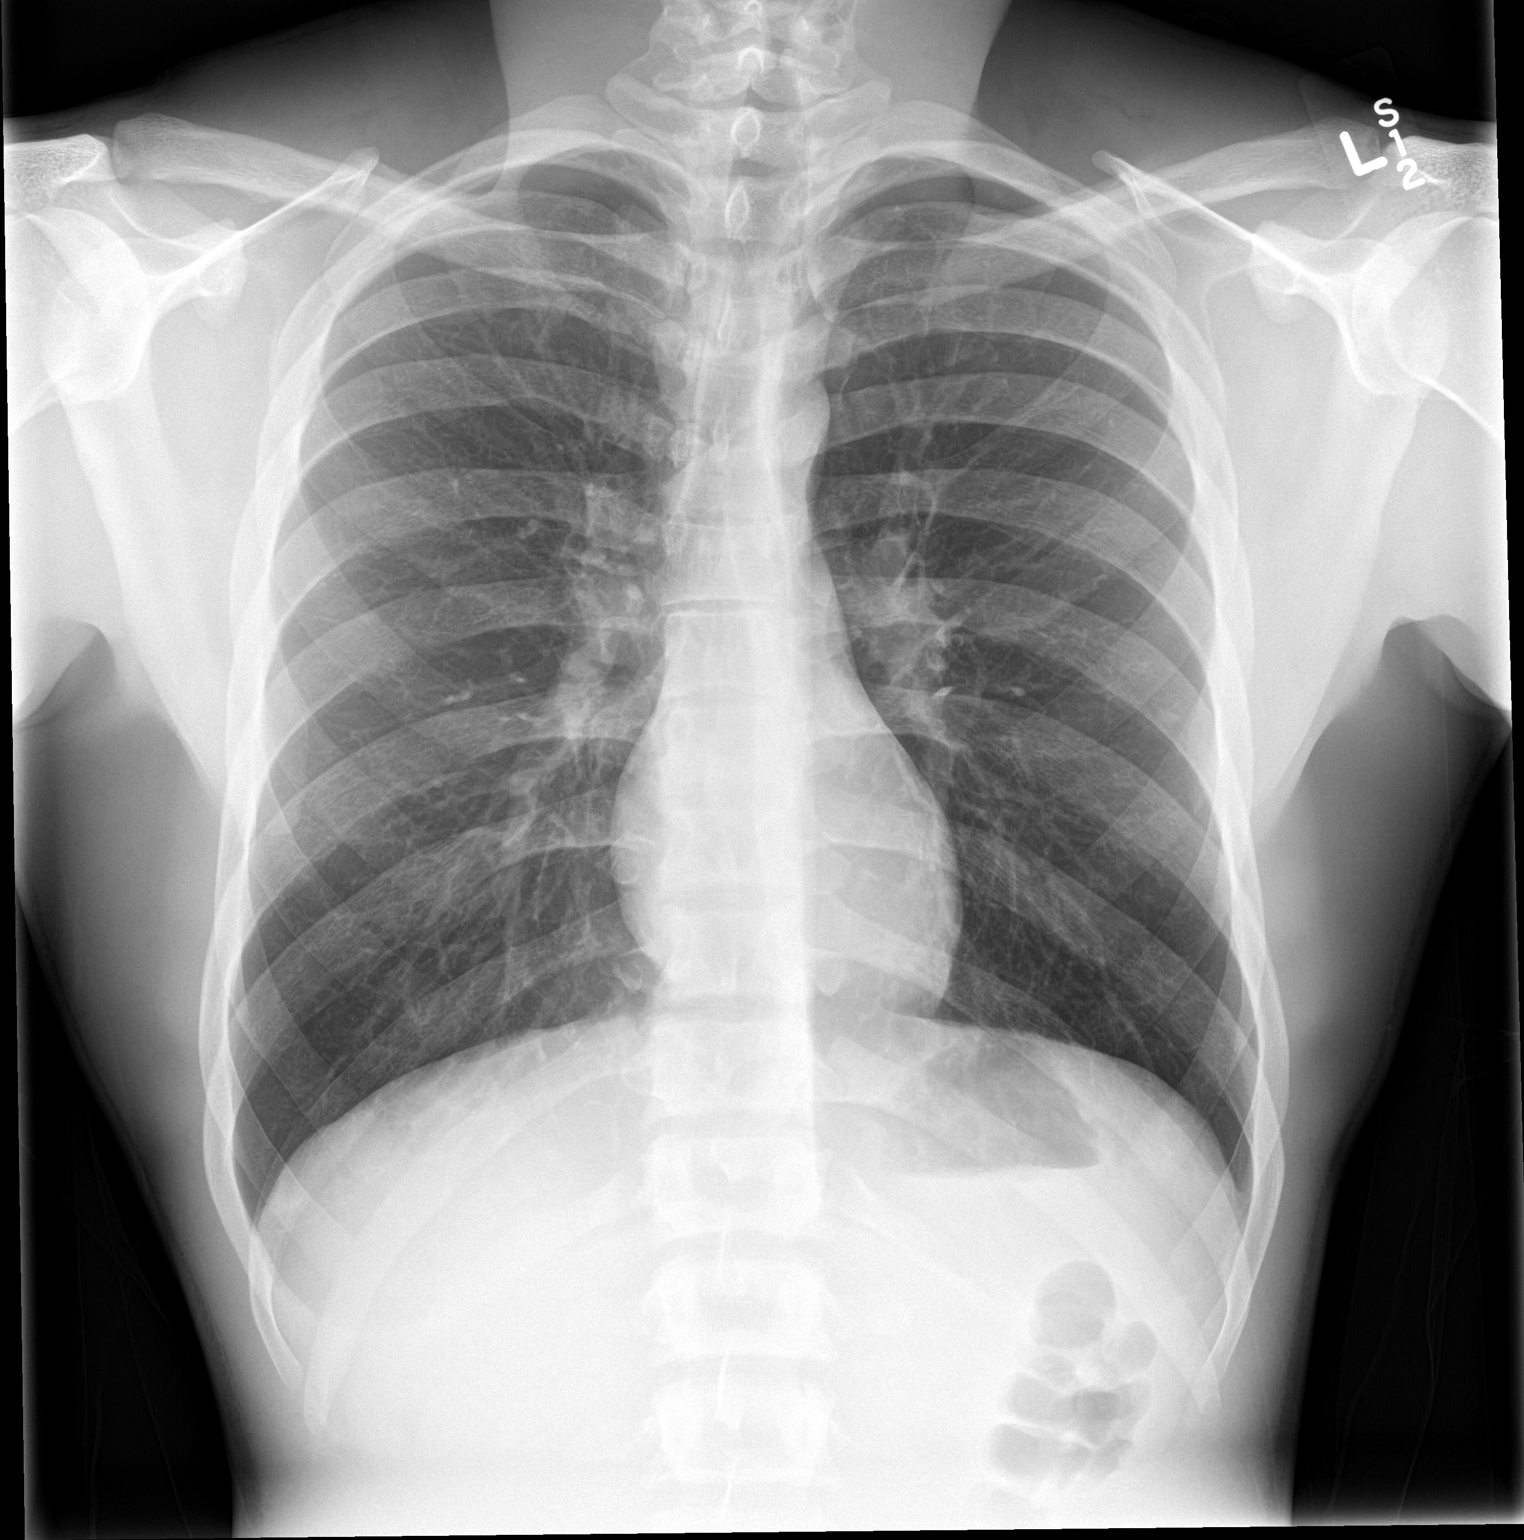

[chest lat]
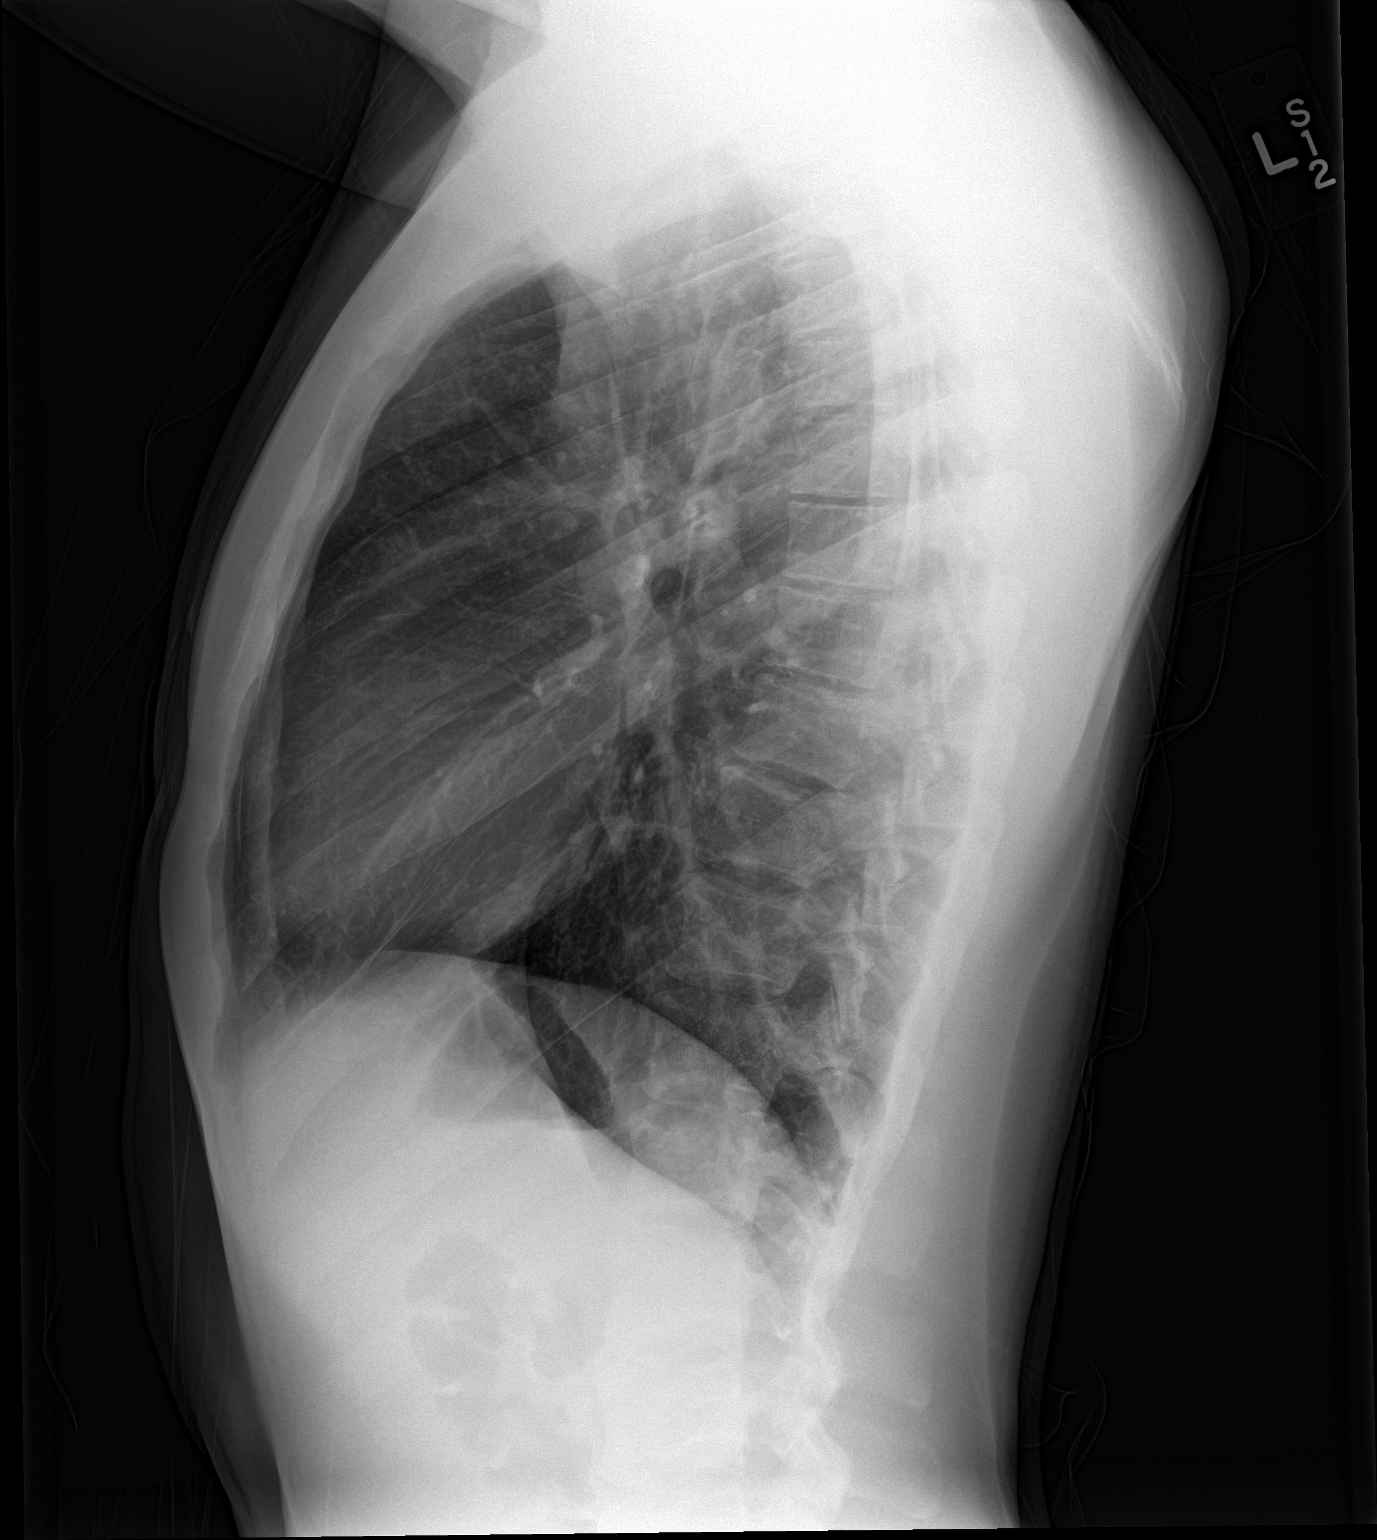

[2 of 2 positions shown; findings below may reference images not displayed]

FINDINGS: Normal sized heart. Clear lungs. Minimal central peribronchial
thickening. Unremarkable bones.
IMPRESSION: Minimal bronchitic changes.

## 2018-02-03 ENCOUNTER — Ambulatory Visit (HOSPITAL_COMMUNITY)
Admission: EM | Admit: 2018-02-03 | Discharge: 2018-02-03 | Disposition: A | Discharge status: Home / Self Care | Payer: 59 | Attending: Family Medicine | Admitting: Family Medicine

## 2018-02-03 ENCOUNTER — Encounter (HOSPITAL_COMMUNITY): Payer: Self-pay | Admitting: Emergency Medicine

## 2018-02-03 DIAGNOSIS — K0889 Other specified disorders of teeth and supporting structures: Secondary | ICD-10-CM

## 2018-02-03 MED ORDER — AMOXICILLIN 875 MG PO TABS: 875.0000 mg | ORAL_TABLET | Freq: Two times a day (BID) | ORAL | 0 refills | Status: AC

## 2018-02-03 MED ORDER — DICLOFENAC SODIUM 75 MG PO TBEC: 75.0000 mg | DELAYED_RELEASE_TABLET | Freq: Two times a day (BID) | ORAL | 0 refills | Status: DC

## 2018-02-03 NOTE — ED Triage Notes (Signed)
Pt sts left sided dental pain with swelling 

## 2018-02-03 NOTE — Discharge Instructions (Signed)
Please follow up with a dentist if you are not feeling better in the next few days.

## 2018-02-03 NOTE — ED Provider Notes (Signed)
[facility]   [id number] 02/03/18 Arrival Time: 0946  ASSESSMENT & PLAN:  1. Pain, dental     Meds ordered this encounter  Medications  . amoxicillin (AMOXIL) 875 MG tablet    Sig: Take 1 tablet (875 mg total) by mouth 2 (two) times daily for 10 days.    Dispense:  20 tablet    Refill:  0  . diclofenac (VOLTAREN) 75 MG EC tablet    Sig: Take 1 tablet (75 mg total) by mouth 2 (two) times daily.    Dispense:  14 tablet    Refill:  0   Dental resource written instructions given. He will schedule dental evaluation as soon as possible.   Reviewed expectations re: course of current medical issues. Questions answered. Outlined signs and symptoms indicating need for more acute intervention. Patient verbalized understanding. After Visit Summary given.   SUBJECTIVE:  Keith Contreras is a 22 y.o. male who reports abrupt onset of left lower dental pain described as aching. Present for 1-2 days. Afebrile. Tolerating PO intake but reports pain with chewing. Normal swallowing. He does not see a dentist regularly. No neck swelling or pain. OTC analgesics without relief.  ROS: As per HPI.  OBJECTIVE:  Vitals:   02/03/18 1000  BP: (!) 139/92  Pulse: 91  Resp: 16  Temp: 99.1 F (37.3 C)  TempSrc: Oral  SpO2: 100%    General appearance: alert; no distress HENT: normocephalic; atraumatic; dentition: good; gingival hypertrophy over left lower gums without areas of fluctuance; normal jaw movement Neck: supple without LAD Lungs: normal respirations Skin: warm and dry Psychological: alert and cooperative; normal mood and affect  No Known Allergies  Past Medical History:  Diagnosis Date  . Asthma    Social History   Socioeconomic History  . Marital status: Single    Spouse name: Not on file  . Number of children: Not on file  . Years of education: Not on file  . Highest education level: Not on file  Occupational History  . Not on file  Social Needs  . Financial  resource strain: Not on file  . Food insecurity:    Worry: Not on file    Inability: Not on file  . Transportation needs:    Medical: Not on file    Non-medical: Not on file  Tobacco Use  . Smoking status: Former Games developer  . Smokeless tobacco: Never Used  Substance and Sexual Activity  . Alcohol use: No  . Drug use: No  . Sexual activity: Not on file  Lifestyle  . Physical activity:    Days per week: Not on file    Minutes per session: Not on file  . Stress: Not on file  Relationships  . Social connections:    Talks on phone: Not on file    Gets together: Not on file    Attends religious service: Not on file    Active member of club or organization: Not on file    Attends meetings of clubs or organizations: Not on file    Relationship status: Not on file  . Intimate partner violence:    Fear of current or ex partner: Not on file    Emotionally abused: Not on file    Physically abused: Not on file    Forced sexual activity: Not on file  Other Topics Concern  . Not on file  Social History Narrative  . Not on file   Family History  Problem Relation Age of Onset  .  Diabetes Father    Past Surgical History:  Procedure Laterality Date  . TESTICULAR EXPLORATION Bilateral 01/24/2017   Procedure: Scrotal Exporation with Left Testicular Torsion Bilateral Pexy.;  Surgeon: Sebastian Ache, MD;  Location: [facility] OR;  Service: Urology;  Laterality: Bilateral;     Mardella Layman, MD 02/03/18 1037

## 2018-03-28 ENCOUNTER — Encounter (HOSPITAL_COMMUNITY): Payer: Self-pay

## 2018-03-28 ENCOUNTER — Ambulatory Visit (HOSPITAL_COMMUNITY)
Admission: EM | Admit: 2018-03-28 | Discharge: 2018-03-28 | Disposition: A | Discharge status: Home / Self Care | Payer: 59 | Attending: Internal Medicine | Admitting: Internal Medicine

## 2018-03-28 DIAGNOSIS — K047 Periapical abscess without sinus: Secondary | ICD-10-CM

## 2018-03-28 DIAGNOSIS — J4520 Mild intermittent asthma, uncomplicated: Secondary | ICD-10-CM

## 2018-03-28 MED ORDER — ALBUTEROL SULFATE HFA 108 (90 BASE) MCG/ACT IN AERS: 2.0000 | INHALATION_SPRAY | RESPIRATORY_TRACT | 0 refills | Status: DC | PRN

## 2018-03-28 MED ORDER — PENICILLIN V POTASSIUM 500 MG PO TABS: 500.0000 mg | ORAL_TABLET | Freq: Four times a day (QID) | ORAL | 0 refills | Status: DC

## 2018-03-28 NOTE — Discharge Instructions (Addendum)
Eat yogurt with probiotics to help your intestine normal bacterial flora to be replenished since antibiotics kills your good bacteria as well.   Finish all the antibiotic til done.]  See your dentist next week.

## 2018-03-28 NOTE — ED Provider Notes (Addendum)
[facility]    CSN: [id number] Arrival date & time: 03/28/18  1004     History   Chief Complaint Chief Complaint  Patient presents with  . Dental Pain    abscess on left side  . Medication Refill    inhaler    HPI Keith Contreras is a 22 y.o. male who has been having pain on his L lower gum region off and on x 4 months. Saw a dentist in the Summer and was placed on an antibiotic for infection similar to what is happening today. Last night developed pain on L lower gum region where he has tooth that needs a root canal, but he is saving the money to afford it. He ate a lot of sweets yesterday and did not brush his teeth right away. Feels the infection is starting again. He states that [facility] helped in the past, was given something different after that but did not help him. Had a few left over which he took last night.   2- He is out of his Proventil inhaler which he rarely uses it, only with change of weather he has to use it due to wheezing. He does not have a PCP. His insurance will kick in after Christmas.     Past Medical History:  Diagnosis Date  . Asthma     There are no active problems to display for this patient.   Past Surgical History:  Procedure Laterality Date  . TESTICULAR EXPLORATION Bilateral 01/24/2017   Procedure: Scrotal Exporation with Left Testicular Torsion Bilateral Pexy.;  Surgeon: Sebastian Ache, MD;  Location: [facility] OR;  Service: Urology;  Laterality: Bilateral;       Home Medications    Prior to Admission medications   Medication Sig Start Date End Date Taking? Authorizing Provider  albuterol (PROVENTIL HFA;VENTOLIN HFA) 108 (90 Base) MCG/ACT inhaler Inhale 2 puffs into the lungs every 4 (four) hours as needed for wheezing or shortness of breath. 03/28/18   Rodriguez-Southworth, Nettie Elm, PA-C  penicillin v potassium (VEETID) 500 MG tablet Take 1 tablet (500 mg total) by mouth 4 (four) times daily. 03/28/18   Rodriguez-Southworth, Nettie Elm,  PA-C    Family History Family History  Problem Relation Age of Onset  . Diabetes Father     Social History Social History   Tobacco Use  . Smoking status: Former Games developer  . Smokeless tobacco: Never Used  Substance Use Topics  . Alcohol use: No  . Drug use: No     Allergies   Patient has no known allergies.   Review of Systems Review of Systems  Constitutional: Positive for chills. Negative for appetite change, diaphoresis and fever.       Chills yesterday  HENT: Negative for congestion, ear discharge, ear pain, postnasal drip, rhinorrhea and sore throat.   Eyes: Negative for discharge.  Respiratory: Positive for cough. Negative for chest tightness and wheezing.        Had wheezing yesterday, but used the last of his inhaler is now is fine.   Cardiovascular: Negative for chest pain.  Gastrointestinal: Negative for nausea and vomiting.  Musculoskeletal: Negative for arthralgias and gait problem.  Skin: Negative for rash.  Neurological: Negative for headaches.  Hematological: Negative for adenopathy.     Physical Exam Triage Vital Signs ED Triage Vitals  Enc Vitals Group     BP 03/28/18 1025 128/73     Pulse Rate 03/28/18 1025 (!) 54     Resp 03/28/18 1025 18  Temp 03/28/18 1025 97.9 F (36.6 C)     Temp Source 03/28/18 1025 Oral     SpO2 03/28/18 1025 95 %     Weight --      Height --      Head Circumference --      Peak Flow --      Pain Score 03/28/18 1027 6     Pain Loc --      Pain Edu? --      Excl. in GC? --    No data found.  Updated Vital Signs BP 128/73 (BP Location: Left Arm)   Pulse (!) 54   Temp 97.9 F (36.6 C) (Oral)   Resp 18   SpO2 95%   Visual Acuity Right Eye Distance:   Left Eye Distance:   Bilateral Distance:    Right Eye Near:   Left Eye Near:    Bilateral Near:     Physical Exam  Constitutional: He is oriented to person, place, and time. He appears well-developed and well-nourished. No distress.  HENT:  Head:  Normocephalic.  Right Ear: External ear normal.  Left Ear: External ear normal.  Nose: Nose normal.  Mouth/Throat: Oropharynx is clear and moist. No oropharyngeal exudate.  Has a carious L lower molar, but gum area is not red or with an abscess. Tapping on it provoked pain.   Eyes: Conjunctivae are normal. Right eye exhibits no discharge. Left eye exhibits no discharge. No scleral icterus.  Neck: Neck supple. No thyromegaly present.  Cardiovascular: Regular rhythm.  No murmur heard. Rate is brady. States he is very physically active  Pulmonary/Chest: Effort normal and breath sounds normal. No respiratory distress.  Musculoskeletal: Normal range of motion.  Lymphadenopathy:    He has no cervical adenopathy.  Neurological: He is alert and oriented to person, place, and time.  Skin: Skin is warm and dry. No rash noted. He is not diaphoretic.  Psychiatric: He has a normal mood and affect. His behavior is normal. Judgment and thought content normal.  Vitals reviewed.    UC Treatments / Results  Labs (all labs ordered are listed, but only abnormal results are displayed) Labs Reviewed - No data to display  EKG None  Radiology No results found.  Medications Ordered in UC Medications - No data to display  Initial Impression / Assessment and Plan / UC Course  I have reviewed the triage vital signs and the nursing notes. Has possibly early dental infection with recurrence. Needs to see his dentist next week I refilled his Proventil inhaler and gave him information to get established with new PCP.  Advised to eat  Yogurt with probiotics when done with antibiotic Also encouraged to finish the full course of the antibiotic.       Final Clinical Impressions(s) / UC Diagnoses   Final diagnoses:  Dental infection  Mild intermittent asthma without complication     Discharge Instructions     Eat yogurt with probiotics to help your intestine normal bacterial flora to be  replenished since antibiotics kills your good bacteria as well.   Finish all the antibiotic til done.]  See your dentist next week.       ED Prescriptions    Medication Sig Dispense Auth. Provider   penicillin v potassium (VEETID) 500 MG tablet Take 1 tablet (500 mg total) by mouth 4 (four) times daily. 28 tablet Rodriguez-Southworth, Rylea Selway, PA-C   albuterol (PROVENTIL HFA;VENTOLIN HFA) 108 (90 Base) MCG/ACT inhaler Inhale 2 puffs into the  lungs every 4 (four) hours as needed for wheezing or shortness of breath. 1 Inhaler Rodriguez-Southworth, Nettie ElmSylvia, PA-C     Controlled Substance Prescriptions [place] Controlled Substance Registry consulted? no   Garey HamRodriguez-Southworth, Brayden Brodhead, PA-C 03/28/18 1103    Rodriguez-Southworth, [place]rito[place]yl[place], PA-C 03/28/18 1104    Rodriguez-Southworth, BethanySylvia, PA-C 03/28/18 1154

## 2018-03-28 NOTE — ED Triage Notes (Signed)
Pt presents with abscess on left side of inside of mouth .  Pt is also wanting a refill on his inhaler.

## 2018-06-17 ENCOUNTER — Ambulatory Visit (HOSPITAL_COMMUNITY)
Admission: EM | Admit: 2018-06-17 | Discharge: 2018-06-17 | Disposition: A | Discharge status: Home / Self Care | Payer: 59 | Attending: Family Medicine | Admitting: Family Medicine

## 2018-06-17 ENCOUNTER — Encounter (HOSPITAL_COMMUNITY): Payer: Self-pay | Admitting: Emergency Medicine

## 2018-06-17 DIAGNOSIS — K0889 Other specified disorders of teeth and supporting structures: Secondary | ICD-10-CM

## 2018-06-17 MED ORDER — AMOXICILLIN-POT CLAVULANATE 875-125 MG PO TABS: 1.0000 | ORAL_TABLET | Freq: Two times a day (BID) | ORAL | 0 refills | Status: DC

## 2018-06-17 MED ORDER — CHLORHEXIDINE GLUCONATE 0.12 % MT SOLN: 15.0000 mL | Freq: Two times a day (BID) | OROMUCOSAL | 3 refills | Status: DC

## 2018-06-17 NOTE — Discharge Instructions (Signed)
Start Augmentin as directed for dental infection. Peridex for dental hygiene. Follow up with dentist for further treatment and evaluation. If experiencing swelling of the throat, trouble breathing, trouble swallowing, leaning forward to breath, drooling, go to the emergency department for further evaluation.   

## 2018-06-17 NOTE — ED Triage Notes (Signed)
Pt states a month ago he lost his antibioitc for his tooth pain and today his tooth is hurting again.

## 2018-06-19 NOTE — ED Provider Notes (Signed)
[facility]    CSN: [id number] Arrival date & time: 06/17/18  6967     History   Chief Complaint Chief Complaint  Patient presents with  . Dental Pain    HPI Keith Contreras is a 23 y.o. male.   23 year old male comes in for few day history of dental pain. He was seen in November 2019 for the same, states he took 2 pills and lost the rest of the pill. Pain resolved so did not follow up for evaluation. For the past few days, started hurting again. Denies sore throat, painful swallowing, trouble breathing swelling of the throat, tripoding, drooling. Denies fever, chills, night sweats. Denies facial swelling, trismus. He has been evaluated by dentist, and is saving up money for the procedure.      Past Medical History:  Diagnosis Date  . Asthma     There are no active problems to display for this patient.   Past Surgical History:  Procedure Laterality Date  . TESTICULAR EXPLORATION Bilateral 01/24/2017   Procedure: Scrotal Exporation with Left Testicular Torsion Bilateral Pexy.;  Surgeon: Sebastian Ache, MD;  Location: [facility] OR;  Service: Urology;  Laterality: Bilateral;       Home Medications    Prior to Admission medications   Medication Sig Start Date End Date Taking? Authorizing Provider  albuterol (PROVENTIL HFA;VENTOLIN HFA) 108 (90 Base) MCG/ACT inhaler Inhale 2 puffs into the lungs every 4 (four) hours as needed for wheezing or shortness of breath. 03/28/18   Rodriguez-Southworth, Nettie Elm, PA-C  amoxicillin-clavulanate (AUGMENTIN) 875-125 MG tablet Take 1 tablet by mouth every 12 (twelve) hours. 06/17/18   Cathie Hoops, Amy V, PA-C  chlorhexidine (PERIDEX) 0.12 % solution Use as directed 15 mLs in the mouth or throat 2 (two) times daily. 06/17/18   Belinda Fisher, PA-C    Family History Family History  Problem Relation Age of Onset  . Diabetes Father     Social History Social History   Tobacco Use  . Smoking status: Former Games developer  . Smokeless tobacco: Never Used   Substance Use Topics  . Alcohol use: No  . Drug use: No     Allergies   Patient has no known allergies.   Review of Systems Review of Systems  Reason unable to perform ROS: See HPI as above.     Physical Exam Triage Vital Signs ED Triage Vitals [06/17/18 1943]  Enc Vitals Group     BP 116/67     Pulse Rate 66     Resp 16     Temp 98.6 F (37 C)     Temp Source Oral     SpO2 100 %     Weight      Height      Head Circumference      Peak Flow      Pain Score 0     Pain Loc      Pain Edu?      Excl. in GC?    No data found.  Updated Vital Signs BP 116/67 (BP Location: Left Arm)   Pulse 66   Temp 98.6 F (37 C) (Oral)   Resp 16   SpO2 100%   Physical Exam Constitutional:      General: He is not in acute distress.    Appearance: He is well-developed. He is not ill-appearing, toxic-appearing or diaphoretic.  HENT:     Head: Normocephalic and atraumatic.     Jaw: No trismus.  Mouth/Throat:     Mouth: Mucous membranes are moist.     Dentition: Dental caries present.     Pharynx: Oropharynx is clear. Uvula midline. No uvula swelling.     Tonsils: No tonsillar exudate.     Comments: Tenderness to palpation of left lower gum. No swelling, fluctuance. Floor of mouth soft to palpation. No facial swelling noted.  Neck:     Musculoskeletal: Normal range of motion and neck supple.  Skin:    General: Skin is warm and dry.  Neurological:     Mental Status: He is alert and oriented to person, place, and time.      UC Treatments / Results  Labs (all labs ordered are listed, but only abnormal results are displayed) Labs Reviewed - No data to display  EKG None  Radiology No results found.  Procedures Procedures (including critical care time)  Medications Ordered in UC Medications - No data to display  Initial Impression / Assessment and Plan / UC Course  I have reviewed the triage vital signs and the nursing notes.  Pertinent labs & imaging  results that were available during my care of the patient were reviewed by me and considered in my medical decision making (see chart for details).    Start antibiotics for possible dental infection. Symptomatic treatment as needed. Discussed with patient symptoms can return if dental problem is not addressed. Follow up with dentist for further evaluation and treatment of dental pain. Resources given. Return precautions given.   Final Clinical Impressions(s) / UC Diagnoses   Final diagnoses:  Pain, dental    ED Prescriptions    Medication Sig Dispense Auth. Provider   amoxicillin-clavulanate (AUGMENTIN) 875-125 MG tablet Take 1 tablet by mouth every 12 (twelve) hours. 14 tablet Yu, Amy V, PA-C   chlorhexidine (PERIDEX) 0.12 % solution Use as directed 15 mLs in the mouth or throat 2 (two) times daily. 473 mL Threasa Alpha, [place] 06/19/18 1715

## 2018-12-01 ENCOUNTER — Ambulatory Visit (HOSPITAL_COMMUNITY)
Admission: EM | Admit: 2018-12-01 | Discharge: 2018-12-01 | Disposition: A | Discharge status: Home / Self Care | Payer: 59 | Attending: Internal Medicine | Admitting: Internal Medicine

## 2018-12-01 ENCOUNTER — Encounter (HOSPITAL_COMMUNITY): Payer: Self-pay

## 2018-12-01 DIAGNOSIS — K047 Periapical abscess without sinus: Secondary | ICD-10-CM | POA: Diagnosis not present

## 2018-12-01 MED ORDER — AMOXICILLIN-POT CLAVULANATE 875-125 MG PO TABS: 1.0000 | ORAL_TABLET | Freq: Two times a day (BID) | ORAL | 0 refills | Status: DC

## 2018-12-01 MED ORDER — CHLORHEXIDINE GLUCONATE 0.12 % MT SOLN: 15.0000 mL | Freq: Two times a day (BID) | OROMUCOSAL | 3 refills | Status: DC

## 2018-12-01 NOTE — ED Provider Notes (Signed)
[facility]    CSN: [id number] Arrival date & time: 12/01/18  1359     History   Chief Complaint Chief Complaint  Patient presents with  . Dental Abscess    HPI Keith Contreras is a 23 y.o. male with a history of mild intermittent asthma controlled with albuterol, dental caries/abscess comes to urgent care with complaints of intermittent tooth ache.  Currently patient has no pain.  Denies any fever or chills.  Pain is in the left mandible.  Patient has been treated a few times for dental abscess and is scheduled to follow-up with the dentist for extraction of his tooth.  HPI  Past Medical History:  Diagnosis Date  . Asthma     There are no active problems to display for this patient.   Past Surgical History:  Procedure Laterality Date  . TESTICULAR EXPLORATION Bilateral 01/24/2017   Procedure: Scrotal Exporation with Left Testicular Torsion Bilateral Pexy.;  Surgeon: Sebastian AcheManny, Theodore, MD;  [facility];  Service: Urology;  Laterality: Bilateral;       Home Medications    Prior to Admission medications   Medication Sig Start Date End Date Taking? Authorizing Provider  albuterol (PROVENTIL HFA;VENTOLIN HFA) 108 (90 Base) MCG/ACT inhaler Inhale 2 puffs into the lungs every 4 (four) hours as needed for wheezing or shortness of breath. 03/28/18   Rodriguez-Southworth, Nettie ElmSylvia, PA-C  amoxicillin-clavulanate (AUGMENTIN) 875-125 MG tablet Take 1 tablet by mouth every 12 (twelve) hours. 12/01/18   Lamptey, Britta MccreedyPhilip O, MD  chlorhexidine (PERIDEX) 0.12 % solution Use as directed 15 mLs in the mouth or throat 2 (two) times daily. 12/01/18   LampteyBritta Mccreedy, Philip O, MD    Family History Family History  Problem Relation Age of Onset  . Diabetes Father     Social History Social History   Tobacco Use  . Smoking status: Former Games developermoker  . Smokeless tobacco: Never Used  Substance Use Topics  . Alcohol use: No  . Drug use: No     Allergies   Patient has no known allergies.   Review of Systems Review of Systems  Constitutional: Negative.  Negative for activity change, chills, fatigue and fever.  HENT: Positive for dental problem and mouth sores. Negative for drooling, ear discharge, ear pain, hearing loss, nosebleeds, rhinorrhea, sinus pressure and sore throat.   Eyes: Negative.   Respiratory: Negative.   Gastrointestinal: Negative.   Musculoskeletal: Negative for arthralgias and back pain.     Physical Exam Triage Vital Signs ED Triage Vitals  Enc Vitals Group     BP      Pulse      Resp      Temp      Temp src      SpO2      Weight      Height      Head Circumference      Peak Flow      Pain Score      Pain Loc      Pain Edu?      Excl. in GC?    No data found.  Updated Vital Signs BP 127/84 (BP Location: Left Arm)   Pulse 70   Temp 98.3 F (36.8 C) (Oral)   Resp 18   SpO2 98%   Visual Acuity Right Eye Distance:   Left Eye Distance:   Bilateral Distance:    Right Eye Near:   Left Eye Near:    Bilateral Near:     Physical Exam  Vitals signs and nursing note reviewed.  Constitutional:      General: He is not in acute distress.    Appearance: He is obese. He is not ill-appearing or toxic-appearing.  HENT:     Right Ear: Tympanic membrane normal.     Left Ear: Tympanic membrane normal.     Nose: Nose normal.     Mouth/Throat:     Mouth: Mucous membranes are moist.     Pharynx: No oropharyngeal exudate or posterior oropharyngeal erythema.     Comments: Dental abscess involving the left first premolar.  Poor dental hygiene Cardiovascular:     Rate and Rhythm: Normal rate and regular rhythm.  Pulmonary:     Effort: Pulmonary effort is normal.     Breath sounds: Normal breath sounds.  Abdominal:     General: Abdomen is flat. Bowel sounds are normal.     Palpations: Abdomen is soft.  Musculoskeletal: Normal range of motion.  Neurological:     Mental Status: He is alert.      UC Treatments / Results  Labs (all labs  ordered are listed, but only abnormal results are displayed) Labs Reviewed - No data to display  EKG   Radiology No results found.  Procedures Procedures (including critical care time)  Medications Ordered in UC Medications - No data to display  Initial Impression / Assessment and Plan / UC Course  I have reviewed the triage vital signs and the nursing notes.  Pertinent labs & imaging results that were available during my care of the patient were reviewed by me and considered in my medical decision making (see chart for details).     1.  Dental abscess: Augmentin 875-125 mg 1 tablet twice daily for 7 days Chlorhexidine mouthwash Ibuprofen over-the-counter as needed for pain Patient is scheduled to have tooth extraction once he is able to find the financial resources for that Patient is advised to return to urgent care if the symptoms worsen.  Final Clinical Impressions(s) / UC Diagnoses   Final diagnoses:  Dental abscess   Discharge Instructions   None    ED Prescriptions    Medication Sig Dispense Auth. Provider   amoxicillin-clavulanate (AUGMENTIN) 875-125 MG tablet Take 1 tablet by mouth every 12 (twelve) hours. 14 tablet Lamptey, Myrene Galas, MD   chlorhexidine (PERIDEX) 0.12 % solution Use as directed 15 mLs in the mouth or throat 2 (two) times daily. 473 mL Chase Picket, MD     Controlled Substance Prescriptions [place] Controlled Substance Registry consulted? No   Chase Picket, MD 12/01/18 1440

## 2018-12-01 NOTE — ED Triage Notes (Signed)
Pt presents for refill on magic mouthwash and antibiotics for dental abscess; pt states that pain is intermittent and not constant.
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
## 2019-10-22 ENCOUNTER — Other Ambulatory Visit: Payer: Self-pay

## 2019-10-22 ENCOUNTER — Emergency Department (HOSPITAL_COMMUNITY)
Admission: EM | Admit: 2019-10-22 | Discharge: 2019-10-22 | Disposition: A | Discharge status: Home / Self Care | Payer: 59 | Attending: Emergency Medicine | Admitting: Emergency Medicine

## 2019-10-22 ENCOUNTER — Encounter (HOSPITAL_COMMUNITY): Payer: Self-pay

## 2019-10-22 DIAGNOSIS — Z79899 Other long term (current) drug therapy: Secondary | ICD-10-CM | POA: Insufficient documentation

## 2019-10-22 DIAGNOSIS — M545 Low back pain: Secondary | ICD-10-CM | POA: Diagnosis not present

## 2019-10-22 DIAGNOSIS — J45909 Unspecified asthma, uncomplicated: Secondary | ICD-10-CM | POA: Diagnosis not present

## 2019-10-22 DIAGNOSIS — M542 Cervicalgia: Secondary | ICD-10-CM | POA: Diagnosis present

## 2019-10-22 DIAGNOSIS — Z87891 Personal history of nicotine dependence: Secondary | ICD-10-CM | POA: Diagnosis not present
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
## 2019-10-25 NOTE — ED Notes (Signed)
Opened chart at patients request to provide a work note.

## 2020-03-03 ENCOUNTER — Ambulatory Visit (HOSPITAL_COMMUNITY)
Admission: EM | Admit: 2020-03-03 | Discharge: 2020-03-03 | Disposition: A | Discharge status: Home / Self Care | Payer: 59

## 2020-03-03 ENCOUNTER — Encounter (HOSPITAL_COMMUNITY): Payer: Self-pay

## 2020-03-03 ENCOUNTER — Other Ambulatory Visit: Payer: Self-pay

## 2020-03-03 NOTE — ED Triage Notes (Signed)
Pt present SOB with difficulty breathing and chest tightness. Symptom started two days ago. Pt states he has a history of asthma and is out of his medication. Pt would like a Nebulizer  machine to complete breathing treatment at home.

## 2020-12-21 ENCOUNTER — Other Ambulatory Visit: Payer: Self-pay

## 2020-12-21 ENCOUNTER — Ambulatory Visit
Admission: EM | Admit: 2020-12-21 | Discharge: 2020-12-21 | Disposition: A | Discharge status: Home / Self Care | Payer: Self-pay | Attending: Emergency Medicine | Admitting: Emergency Medicine

## 2020-12-21 ENCOUNTER — Encounter: Payer: Self-pay | Admitting: Emergency Medicine

## 2020-12-21 ENCOUNTER — Telehealth: Payer: Self-pay | Admitting: Emergency Medicine

## 2020-12-21 DIAGNOSIS — J4521 Mild intermittent asthma with (acute) exacerbation: Secondary | ICD-10-CM

## 2020-12-21 MED ORDER — PREDNISONE 50 MG PO TABS: 50.0000 mg | ORAL_TABLET | Freq: Every day | ORAL | 0 refills | Status: AC

## 2020-12-21 MED ORDER — ALBUTEROL SULFATE HFA 108 (90 BASE) MCG/ACT IN AERS: 1.0000 | INHALATION_SPRAY | Freq: Four times a day (QID) | RESPIRATORY_TRACT | 0 refills | Status: DC | PRN

## 2020-12-21 MED ORDER — NEBULIZER SYSTEM ALL-IN-ONE MISC: 1.0000 | Freq: Once | 0 refills | Status: AC

## 2020-12-21 MED ORDER — PREDNISONE 50 MG PO TABS: 50.0000 mg | ORAL_TABLET | Freq: Every day | ORAL | 0 refills | Status: DC

## 2020-12-21 MED ORDER — IPRATROPIUM-ALBUTEROL 0.5-2.5 (3) MG/3ML IN SOLN: 3.0000 mL | RESPIRATORY_TRACT | 0 refills | Status: AC | PRN

## 2020-12-21 MED ORDER — ALBUTEROL SULFATE HFA 108 (90 BASE) MCG/ACT IN AERS: 2.0000 | INHALATION_SPRAY | Freq: Once | RESPIRATORY_TRACT | Status: DC

## 2020-12-21 NOTE — Medical Student Note (Signed)
[facility] Provider Student Note For educational purposes for Medical, PA and NP students only and not part of the legal medical record.   CSN: [id number] Arrival date & time: 12/21/20  1049      History   Chief Complaint Chief Complaint  Patient presents with   Asthma    HPI Keith Contreras is a 25 y.o. male.  Patient reports to the UC with complaint of asthma exacerbation. Patient reports a history of asthma exacerbation with weather changes and states he is out of his albuterol inhaler. Patient endorses a "hit on a vape" before coming in as he states he normally smokes marijuana but did not want to be smelled in the office, therefore he utilized a Vape Pen in his car that he does not reportedly regularly use. States he has been worsening with wheezing over the past 2 days.   The history is provided by the patient.  Asthma This is a new problem. The current episode started 2 days ago. The problem has been gradually worsening. Pertinent negatives include no chest pain, no abdominal pain, no headaches and no shortness of breath. Nothing aggravates the symptoms.   Past Medical History:  Diagnosis Date   Asthma     There are no problems to display for this patient.   Past Surgical History:  Procedure Laterality Date   TESTICULAR EXPLORATION Bilateral 01/24/2017   Procedure: Scrotal Exporation with Left Testicular Torsion Bilateral Pexy.;  Surgeon: Sebastian Ache, MD;  Location: [facility] OR;  Service: Urology;  Laterality: Bilateral;       Home Medications    Prior to Admission medications   Medication Sig Start Date End Date Taking? Authorizing Provider  ipratropium-albuterol (DUONEB) 0.5-2.5 (3) MG/3ML SOLN Take 3 mLs by nebulization every 4 (four) hours as needed. 12/21/20  Yes Wieters, Hallie C, PA-C  Nebulizer System All-In-One MISC 1 each by Does not apply route once for 1 dose. 12/21/20 12/21/20 Yes Wieters, Hallie C, PA-C  predniSONE (DELTASONE) 50 MG tablet  Take 1 tablet (50 mg total) by mouth daily with breakfast for 5 days. 12/21/20 12/26/20 Yes Wieters, Hallie C, PA-C  albuterol (PROVENTIL HFA;VENTOLIN HFA) 108 (90 Base) MCG/ACT inhaler Inhale 2 puffs into the lungs every 4 (four) hours as needed for wheezing or shortness of breath. 03/28/18   Rodriguez-Southworth, Nettie Elm, PA-C    Family History Family History  Problem Relation Age of Onset   Diabetes Father     Social History Social History   Tobacco Use   Smoking status: Former   Smokeless tobacco: Never  Building services engineer Use: Some days   Devices: used it in the last 24 hours  Substance Use Topics   Alcohol use: No   Drug use: Yes    Types: Marijuana     Allergies   Patient has no known allergies.   Review of Systems Review of Systems  Constitutional:  Negative for activity change, appetite change and fever.  HENT:  Negative for congestion.   Respiratory:  Positive for wheezing. Negative for chest tightness and shortness of breath.   Cardiovascular:  Negative for chest pain.  Gastrointestinal:  Negative for abdominal pain.  Neurological:  Negative for dizziness and headaches.  Psychiatric/Behavioral:  Negative for agitation and behavioral problems.     Physical Exam Updated Vital Signs BP (!) 156/104 (BP Location: Left Arm)   Pulse 74   Temp 98.4 F (36.9 C) (Oral)   Resp 18   SpO2 95%  Physical Exam Constitutional:      General: He is not in acute distress.    Appearance: Normal appearance. He is normal weight. He is not ill-appearing, toxic-appearing or diaphoretic.  Cardiovascular:     Heart sounds: Normal heart sounds.    No friction rub.  Pulmonary:     Effort: Pulmonary effort is normal. No tachypnea, accessory muscle usage, respiratory distress or retractions.     Breath sounds: No decreased air movement. Examination of the right-upper field reveals wheezing. Examination of the left-upper field reveals wheezing. Examination of the right-middle  field reveals wheezing. Examination of the left-middle field reveals wheezing. Examination of the right-lower field reveals wheezing. Examination of the left-lower field reveals wheezing. Wheezing present. No decreased breath sounds.  Chest:     Chest wall: No tenderness.  Neurological:     General: No focal deficit present.     Mental Status: He is alert and oriented to person, place, and time.  Psychiatric:        Mood and Affect: Mood normal.        Behavior: Behavior normal.        Thought Content: Thought content normal.        Judgment: Judgment normal.     ED Treatments / Results  Labs (all labs ordered are listed, but only abnormal results are displayed) Labs Reviewed - No data to display  EKG  Radiology No results found.  Procedures Procedures (including critical care time)  Medications Ordered in ED Medications  albuterol (VENTOLIN HFA) 108 (90 Base) MCG/ACT inhaler 2 puff (has no administration in time range)     Initial Impression / Assessment and Plan / ED Course  Prescription sent to pharmacy for nebulizer system with duo-nebulizer solution for asthma exacerbation. Patient also prescribed 5 day course of steroids to d/c inflammation. Refilled albuterol inhaler for asthma exacerbation. Patient reports understanding and agreement with plan. Counseled on importance of discontinuing vape use with respiratory history, patient states he does not regularly use Vape but does regularly use Marijuana.   Final Clinical Impressions(s) / ED Diagnoses   Final diagnoses:  Mild intermittent asthma with acute exacerbation    New Prescriptions New Prescriptions   IPRATROPIUM-ALBUTEROL (DUONEB) 0.5-2.5 (3) MG/3ML SOLN    Take 3 mLs by nebulization every 4 (four) hours as needed.   NEBULIZER SYSTEM ALL-IN-ONE MISC    1 each by Does not apply route once for 1 dose.   PREDNISONE (DELTASONE) 50 MG TABLET    Take 1 tablet (50 mg total) by mouth daily with breakfast for 5 days.

## 2020-12-21 NOTE — Telephone Encounter (Signed)
Pharmacy states they did not receive Prednisone prescription, resending.

## 2020-12-21 NOTE — ED Triage Notes (Signed)
Patient presents to [facility] for shortness of breath, back tightness, and wheezing x 3 days.  Looking for a breathing treatment or inhaler refill

## 2020-12-21 NOTE — Discharge Instructions (Addendum)
Please use albuterol inhaler 1 to 2 puffs every 4-6 hours as needed Begin prednisone daily for 5 days-take with food and earlier in the day if possible May use nebulizers Please follow-up if not improving or worsening

## 2020-12-22 NOTE — ED Provider Notes (Signed)
[facility]    CSN: [id number] Arrival date & time: 12/21/20  1049      History   Chief Complaint Chief Complaint  Patient presents with   Asthma    HPI Keith Contreras is a 25 y.o. male history of asthma presenting today for evaluation of asthma flare.  Reports over the past 45 days has had increased shortness of breath, chest tightness and wheezing with cough.  Denies fevers chills or body aches.  Denies COVID exposure.  Reports similar flaring of asthma around season changes.  Does not have inhaler at home.  HPI  Past Medical History:  Diagnosis Date   Asthma     There are no problems to display for this patient.   Past Surgical History:  Procedure Laterality Date   TESTICULAR EXPLORATION Bilateral 01/24/2017   Procedure: Scrotal Exporation with Left Testicular Torsion Bilateral Pexy.;  Surgeon: Sebastian Ache, MD;  Location: [facility] OR;  Service: Urology;  Laterality: Bilateral;       Home Medications    Prior to Admission medications   Medication Sig Start Date End Date Taking? Authorizing Provider  albuterol (VENTOLIN HFA) 108 (90 Base) MCG/ACT inhaler Inhale 1-2 puffs into the lungs every 6 (six) hours as needed for wheezing or shortness of breath. 12/21/20  Yes Clevland Cork C, PA-C  ipratropium-albuterol (DUONEB) 0.5-2.5 (3) MG/3ML SOLN Take 3 mLs by nebulization every 4 (four) hours as needed. 12/21/20  Yes Karlee Staff C, PA-C  predniSONE (DELTASONE) 50 MG tablet Take 1 tablet (50 mg total) by mouth daily with breakfast for 5 days. 12/21/20 12/26/20  Jules Baty, Junius Creamer, PA-C    Family History Family History  Problem Relation Age of Onset   Diabetes Father     Social History Social History   Tobacco Use   Smoking status: Former   Smokeless tobacco: Never  Building services engineer Use: Some days   Devices: used it in the last 24 hours  Substance Use Topics   Alcohol use: No   Drug use: Yes    Types: Marijuana     Allergies   Patient has no  known allergies.   Review of Systems Review of Systems  Constitutional:  Negative for activity change, appetite change, chills, fatigue and fever.  HENT:  Positive for congestion. Negative for ear pain, rhinorrhea, sinus pressure, sore throat and trouble swallowing.   Eyes:  Negative for discharge and redness.  Respiratory:  Positive for cough, chest tightness, shortness of breath and wheezing.   Cardiovascular:  Negative for chest pain.  Gastrointestinal:  Negative for abdominal pain, diarrhea, nausea and vomiting.  Musculoskeletal:  Negative for myalgias.  Skin:  Negative for rash.  Neurological:  Negative for dizziness, light-headedness and headaches.    Physical Exam Triage Vital Signs ED Triage Vitals [12/21/20 1116]  Enc Vitals Group     BP (!) 156/104     Pulse Rate 74     Resp 18     Temp 98.4 F (36.9 C)     Temp Source Oral     SpO2 95 %     Weight      Height      Head Circumference      Peak Flow      Pain Score 0     Pain Loc      Pain Edu?      Excl. in GC?    No data found.  Updated Vital Signs BP (!) 156/104 (BP Location: Left  Arm)   Pulse 74   Temp 98.4 F (36.9 C) (Oral)   Resp 18   SpO2 95%   Visual Acuity Right Eye Distance:   Left Eye Distance:   Bilateral Distance:    Right Eye Near:   Left Eye Near:    Bilateral Near:     Physical Exam Vitals and nursing note reviewed.  Constitutional:      Appearance: He is well-developed.     Comments: No acute distress  HENT:     Head: Normocephalic and atraumatic.     Nose: Nose normal.     Mouth/Throat:     Comments: Oral mucosa pink and moist, no tonsillar enlargement or exudate. Posterior pharynx patent and nonerythematous, no uvula deviation or swelling. Normal phonation.   Eyes:     Conjunctiva/sclera: Conjunctivae normal.  Cardiovascular:     Rate and Rhythm: Normal rate and regular rhythm.  Pulmonary:     Effort: Pulmonary effort is normal. No respiratory distress.     Breath  sounds: Wheezing present.     Comments: Respiratory respiratory wheezing throughout bilateral lung fields  Occasional coarse cough in room Abdominal:     General: There is no distension.  Musculoskeletal:        General: Normal range of motion.     Cervical back: Neck supple.  Skin:    General: Skin is warm and dry.  Neurological:     Mental Status: He is alert and oriented to person, place, and time.     UC Treatments / Results  Labs (all labs ordered are listed, but only abnormal results are displayed) Labs Reviewed - No data to display  EKG   Radiology No results found.  Procedures Procedures (including critical care time)  Medications Ordered in UC Medications - No data to display  Initial Impression / Assessment and Plan / UC Course  I have reviewed the triage vital signs and the nursing notes.  Pertinent labs & imaging results that were available during my care of the patient were reviewed by me and considered in my medical decision making (see chart for details).     Asthma exacerbation-provided albuterol inhaler, printed prescription for nebulizer/DuoNeb's as well as prednisone course.  No signs of respiratory distress at this time, advised close monitoring of symptoms,Discussed strict return precautions. Patient verbalized understanding and is agreeable with plan.  Final Clinical Impressions(s) / UC Diagnoses   Final diagnoses:  Mild intermittent asthma with acute exacerbation     Discharge Instructions      Please use albuterol inhaler 1 to 2 puffs every 4-6 hours as needed Begin prednisone daily for 5 days-take with food and earlier in the day if possible May use nebulizers Please follow-up if not improving or worsening     ED Prescriptions     Medication Sig Dispense Auth. Provider   predniSONE (DELTASONE) 50 MG tablet Take 1 tablet (50 mg total) by mouth daily with breakfast for 5 days. 5 tablet Dontario Evetts, [place] C, PA-C   Nebulizer System  All-In-One MISC 1 each by Does not apply route once for 1 dose. 1 each Anyae Griffith C, PA-C   ipratropium-albuterol (DUONEB) 0.5-2.5 (3) MG/3ML SOLN Take 3 mLs by nebulization every 4 (four) hours as needed. 360 mL Kenslei Hearty C, PA-C   albuterol (VENTOLIN HFA) 108 (90 Base) MCG/ACT inhaler Inhale 1-2 puffs into the lungs every 6 (six) hours as needed for wheezing or shortness of breath. 1 each Irving Lubbers, [place] C, PA-C  PDMP not reviewed this encounter.   Lew Dawes, [place] 12/22/20 [phone]

## 2022-02-20 ENCOUNTER — Emergency Department (HOSPITAL_COMMUNITY)
Admission: EM | Admit: 2022-02-20 | Discharge: 2022-02-20 | Disposition: A | Discharge status: Home / Self Care | Payer: No Typology Code available for payment source | Attending: Emergency Medicine | Admitting: Emergency Medicine

## 2022-02-20 ENCOUNTER — Encounter (HOSPITAL_COMMUNITY): Payer: Self-pay

## 2022-02-20 DIAGNOSIS — S3992XA Unspecified injury of lower back, initial encounter: Secondary | ICD-10-CM | POA: Insufficient documentation

## 2022-02-20 DIAGNOSIS — S4991XA Unspecified injury of right shoulder and upper arm, initial encounter: Secondary | ICD-10-CM | POA: Insufficient documentation

## 2022-02-20 DIAGNOSIS — Y92410 Unspecified street and highway as the place of occurrence of the external cause: Secondary | ICD-10-CM | POA: Diagnosis not present

## 2022-02-20 MED ORDER — CYCLOBENZAPRINE HCL 10 MG PO TABS: 5.0000 mg | ORAL_TABLET | Freq: Two times a day (BID) | ORAL | 0 refills | Status: DC | PRN

## 2022-02-20 MED ORDER — NAPROXEN 375 MG PO TABS: 375.0000 mg | ORAL_TABLET | Freq: Two times a day (BID) | ORAL | 0 refills | Status: DC

## 2022-02-20 NOTE — Discharge Instructions (Signed)
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
## 2022-03-28 ENCOUNTER — Ambulatory Visit
Admission: EM | Admit: 2022-03-28 | Discharge: 2022-03-28 | Disposition: A | Discharge status: Home / Self Care | Payer: Self-pay | Attending: Urgent Care | Admitting: Urgent Care

## 2022-03-28 DIAGNOSIS — J4530 Mild persistent asthma, uncomplicated: Secondary | ICD-10-CM

## 2022-03-28 MED ORDER — ALBUTEROL SULFATE HFA 108 (90 BASE) MCG/ACT IN AERS: 1.0000 | INHALATION_SPRAY | Freq: Four times a day (QID) | RESPIRATORY_TRACT | 0 refills | Status: DC | PRN

## 2022-03-28 MED ORDER — PREDNISONE 50 MG PO TABS: 50.0000 mg | ORAL_TABLET | Freq: Every day | ORAL | 0 refills | Status: DC

## 2022-03-28 MED ORDER — PROMETHAZINE-DM 6.25-15 MG/5ML PO SYRP: 5.0000 mL | ORAL_SOLUTION | Freq: Three times a day (TID) | ORAL | 0 refills | Status: DC | PRN

## 2022-03-28 NOTE — ED Triage Notes (Signed)
Pt states coughing and wheezing for the past 2 days. States he needs a refill on his inhaler.

## 2022-03-28 NOTE — ED Provider Notes (Signed)
[facility]  Note:  This document was prepared using Systems analyst and may include unintentional dictation errors.  MRN: [id number] DOB: [birth year]  Subjective:   Keith Contreras is a 26 y.o. male presenting for medication refill of his albuterol inhaler. Has 2 day history of persistent wheezing and chest tightness, coughing. Has albuterol nebulized solution at home, has not used this. No fever, chest pain, body aches.   No current facility-administered medications for this encounter.  Current Outpatient Medications:    albuterol (VENTOLIN HFA) 108 (90 Base) MCG/ACT inhaler, Inhale 1-2 puffs into the lungs every 6 (six) hours as needed for wheezing or shortness of breath., Disp: 1 each, Rfl: 0   cyclobenzaprine (FLEXERIL) 10 MG tablet, Take 0.5-1 tablets (5-10 mg total) by mouth 2 (two) times daily as needed for muscle spasms., Disp: 20 tablet, Rfl: 0   ipratropium-albuterol (DUONEB) 0.5-2.5 (3) MG/3ML SOLN, Take 3 mLs by nebulization every 4 (four) hours as needed., Disp: 360 mL, Rfl: 0   naproxen (NAPROSYN) 375 MG tablet, Take 1 tablet (375 mg total) by mouth 2 (two) times daily with a meal., Disp: 20 tablet, Rfl: 0   No Known Allergies  Past Medical History:  Diagnosis Date   Asthma      Past Surgical History:  Procedure Laterality Date   TESTICULAR EXPLORATION Bilateral 01/24/2017   Procedure: Scrotal Exporation with Left Testicular Torsion Bilateral Pexy.;  Surgeon: Alexis Frock, MD;  Location: [place];  Service: Urology;  Laterality: Bilateral;    Family History  Problem Relation Age of Onset   Diabetes Father     Social History   Tobacco Use   Smoking status: Former   Smokeless tobacco: Never  Scientific laboratory technician Use: Some days   Devices: used it in the last 24 hours  Substance Use Topics   Alcohol use: No   Drug use: Yes    Types: Marijuana    ROS   Objective:   Vitals: BP 134/81 (BP Location: Right Arm)    Pulse 63   Temp 98.4 F (36.9 C) (Oral)   Resp 16   SpO2 95%   Physical Exam Constitutional:      General: He is not in acute distress.    Appearance: Normal appearance. He is well-developed. He is not ill-appearing, toxic-appearing or diaphoretic.  HENT:     Head: Normocephalic and atraumatic.     Right Ear: External ear normal.     Left Ear: External ear normal.     Nose: Nose normal.     Mouth/Throat:     Mouth: Mucous membranes are moist.  Eyes:     General: No scleral icterus.       Right eye: No discharge.        Left eye: No discharge.     Extraocular Movements: Extraocular movements intact.  Cardiovascular:     Rate and Rhythm: Normal rate and regular rhythm.     Heart sounds: Normal heart sounds. No murmur heard.    No friction rub. No gallop.  Pulmonary:     Effort: Pulmonary effort is normal. No respiratory distress.     Breath sounds: No stridor. Wheezing and rhonchi present. No rales.  Neurological:     Mental Status: He is alert and oriented to person, place, and time.  Psychiatric:        Mood and Affect: Mood normal.        Behavior: Behavior normal.  Thought Content: Thought content normal.     Assessment and Plan :   PDMP not reviewed this encounter.  1. Mild persistent asthma without complication     Patient declined chest x-ray, respiratory swab, nebulizer treatment in clinic. Recommended oral prednisone, refilled his albuterol. Use supportive care otherwise. Counseled patient on potential for adverse effects with medications prescribed/recommended today, ER and return-to-clinic precautions discussed, patient verbalized understanding.    Wallis Bamberg, PA-C 03/28/22 1206

## 2022-04-24 ENCOUNTER — Ambulatory Visit
Admission: EM | Admit: 2022-04-24 | Discharge: 2022-04-24 | Disposition: A | Discharge status: Home / Self Care | Payer: Self-pay | Attending: Urgent Care | Admitting: Urgent Care

## 2022-04-24 DIAGNOSIS — J4530 Mild persistent asthma, uncomplicated: Secondary | ICD-10-CM

## 2022-04-24 DIAGNOSIS — B349 Viral infection, unspecified: Secondary | ICD-10-CM

## 2022-04-24 MED ORDER — PREDNISONE 50 MG PO TABS: 50.0000 mg | ORAL_TABLET | Freq: Every day | ORAL | 0 refills | Status: AC

## 2022-04-24 MED ORDER — PROMETHAZINE-DM 6.25-15 MG/5ML PO SYRP: 2.5000 mL | ORAL_SOLUTION | Freq: Three times a day (TID) | ORAL | 0 refills | Status: AC | PRN

## 2022-04-24 NOTE — Discharge Instructions (Addendum)
We will notify you of your test results as they arrive and may take between about 24 hours.  I encourage you to sign up for MyChart if you have not already done so as this can be the easiest way for us to communicate results to you online or through a phone app.  Generally, we only contact you if it is a positive test result.  In the meantime, if you develop worsening symptoms including fever, chest pain, shortness of breath despite our current treatment plan then please report to the emergency room as this may be a sign of worsening status from possible viral infection.  Otherwise, we will manage this as a viral syndrome. For sore throat or cough try using a honey-based tea. Use 3 teaspoons of honey with juice squeezed from half lemon. Place shaved pieces of ginger into 1/2-1 cup of water and warm over stove top. Then mix the ingredients and repeat every 4 hours as needed. Please take Tylenol 500mg-650mg every 6 hours for aches and pains, fevers. Hydrate very well with at least 2 liters of water. Eat light meals such as soups to replenish electrolytes and soft fruits, veggies. Start an antihistamine like Zyrtec for postnasal drainage, sinus congestion.  You can take this together with prednisone.  Use the cough medications as needed.   

## 2022-04-24 NOTE — ED Triage Notes (Signed)
Pt c/o joint pain started 2 days ago-states is sx of asthma flare and needs a neb tx-states he left his neb tx at girlfriend's house ~2 hours away-NAD-steady gait

## 2022-04-24 NOTE — ED Provider Notes (Signed)
[facility]  Note:  This document was prepared using Conservation officer, historic buildings and may include unintentional dictation errors.  MRN: [id number] DOB: 12-May-1995  Subjective:   Keith Contreras is a 26 y.o. male presenting for 2-day history of acute onset malaise, fatigue, sinus congestion and pressure, sinus headaches, body aches over his back, knees and arms.  He has had some coughing.  Was concerned about his breathing due to his asthma and therefore started using nebulizer treatments.  He used 1 yesterday and today and feels improved.  No overt chest pain, fevers.  Does not want to be tested for any respiratory illnesses.  Patient is a smoker, uses marijuana and vapes.  No current facility-administered medications for this encounter.  Current Outpatient Medications:    albuterol (VENTOLIN HFA) 108 (90 Base) MCG/ACT inhaler, Inhale 1-2 puffs into the lungs every 6 (six) hours as needed for wheezing or shortness of breath., Disp: 18 g, Rfl: 0   cyclobenzaprine (FLEXERIL) 10 MG tablet, Take 0.5-1 tablets (5-10 mg total) by mouth 2 (two) times daily as needed for muscle spasms., Disp: 20 tablet, Rfl: 0   ipratropium-albuterol (DUONEB) 0.5-2.5 (3) MG/3ML SOLN, Take 3 mLs by nebulization every 4 (four) hours as needed., Disp: 360 mL, Rfl: 0   naproxen (NAPROSYN) 375 MG tablet, Take 1 tablet (375 mg total) by mouth 2 (two) times daily with a meal., Disp: 20 tablet, Rfl: 0   predniSONE (DELTASONE) 50 MG tablet, Take 1 tablet (50 mg total) by mouth daily with breakfast., Disp: 5 tablet, Rfl: 0   promethazine-dextromethorphan (PROMETHAZINE-DM) 6.25-15 MG/5ML syrup, Take 5 mLs by mouth 3 (three) times daily as needed for cough., Disp: 200 mL, Rfl: 0   No Known Allergies  Past Medical History:  Diagnosis Date   Asthma      Past Surgical History:  Procedure Laterality Date   TESTICULAR EXPLORATION Bilateral 01/24/2017   Procedure: Scrotal Exporation with Left  Testicular Torsion Bilateral Pexy.;  Surgeon: Sebastian Ache, MD;  Location: [facility] OR;  Service: Urology;  Laterality: Bilateral;    Family History  Problem Relation Age of Onset   Diabetes Father     Social History   Tobacco Use   Smoking status: Never   Smokeless tobacco: Never  Vaping Use   Vaping Use: Some days  Substance Use Topics   Alcohol use: No   Drug use: Yes    Types: Marijuana    ROS   Objective:   Vitals: BP 118/77 (BP Location: Left Arm)   Pulse 84   Temp 99.5 F (37.5 C) (Oral)   Resp 16   SpO2 93%   Physical Exam Constitutional:      General: He is not in acute distress.    Appearance: Normal appearance. He is well-developed. He is not ill-appearing, toxic-appearing or diaphoretic.  HENT:     Head: Normocephalic and atraumatic.     Right Ear: External ear normal.     Left Ear: External ear normal.     Nose: Nose normal.     Mouth/Throat:     Mouth: Mucous membranes are moist.  Eyes:     General: No scleral icterus.       Right eye: No discharge.        Left eye: No discharge.     Extraocular Movements: Extraocular movements intact.  Cardiovascular:     Rate and Rhythm: Normal rate and regular rhythm.     Heart sounds: Normal heart sounds. No  murmur heard.    No friction rub. No gallop.  Pulmonary:     Effort: Pulmonary effort is normal. No respiratory distress.     Breath sounds: Normal breath sounds. No stridor. No wheezing, rhonchi or rales.  Neurological:     Mental Status: He is alert and oriented to person, place, and time.  Psychiatric:        Mood and Affect: Mood normal.        Behavior: Behavior normal.        Thought Content: Thought content normal.     Assessment and Plan :   PDMP not reviewed this encounter.  1. Acute viral syndrome   2. Mild persistent asthma without complication     Lung sounds are clear, deferred nebulizer treatment.  Recommended oral prednisone course in the context of his asthma and allergic  rhinitis. Deferred imaging given clear cardiopulmonary exam, hemodynamically stable vital signs.  Patient declined respiratory testing.  Use supportive care otherwise for an acute viral syndrome. Counseled patient on potential for adverse effects with medications prescribed/recommended today, ER and return-to-clinic precautions discussed, patient verbalized understanding.    Wallis Bamberg, [place] 04/24/22 [phone]

## 2023-04-08 ENCOUNTER — Encounter (HOSPITAL_COMMUNITY): Payer: Self-pay

## 2023-04-08 ENCOUNTER — Emergency Department (HOSPITAL_COMMUNITY)
Admission: EM | Admit: 2023-04-08 | Discharge: 2023-04-08 | Discharge status: Against Medical Advice | Payer: 59 | Attending: Emergency Medicine | Admitting: Emergency Medicine

## 2023-04-08 ENCOUNTER — Other Ambulatory Visit: Payer: Self-pay

## 2023-04-08 DIAGNOSIS — Z5321 Procedure and treatment not carried out due to patient leaving prior to being seen by health care provider: Secondary | ICD-10-CM | POA: Diagnosis not present

## 2023-04-08 DIAGNOSIS — K0889 Other specified disorders of teeth and supporting structures: Secondary | ICD-10-CM | POA: Insufficient documentation

## 2023-04-08 DIAGNOSIS — R6884 Jaw pain: Secondary | ICD-10-CM | POA: Insufficient documentation

## 2023-04-08 NOTE — ED Triage Notes (Signed)
Pt c/o left sided upper and lower jaw pain. Pt states he has a cavity on left lower and is supposed to be getting a root canal soon. Pt states pain started worsening yesterday.

## 2023-04-08 NOTE — ED Notes (Signed)
Pt left the lobby at 0505.

## 2023-06-10 ENCOUNTER — Encounter (HOSPITAL_COMMUNITY): Payer: Self-pay

## 2023-06-10 ENCOUNTER — Ambulatory Visit (HOSPITAL_COMMUNITY)
Admission: EM | Admit: 2023-06-10 | Discharge: 2023-06-10 | Disposition: A | Discharge status: Home / Self Care | Payer: 59 | Attending: Physician Assistant | Admitting: Physician Assistant

## 2023-06-10 DIAGNOSIS — Z760 Encounter for issue of repeat prescription: Secondary | ICD-10-CM | POA: Diagnosis present

## 2023-06-10 DIAGNOSIS — R590 Localized enlarged lymph nodes: Secondary | ICD-10-CM | POA: Insufficient documentation

## 2023-06-10 MED ORDER — ALBUTEROL SULFATE HFA 108 (90 BASE) MCG/ACT IN AERS: 1.0000 | INHALATION_SPRAY | Freq: Four times a day (QID) | RESPIRATORY_TRACT | 1 refills | Status: AC | PRN

## 2023-06-10 NOTE — ED Triage Notes (Signed)
Pt need a refill for his albuterol inhaler.   Pot also c/o of a bulge on his inguinal area that he noticed on Saturday.

## 2023-06-10 NOTE — Discharge Instructions (Addendum)
I suspect that the lumps that you are feeling in your groin area are probably lymph nodes.  These are usually not this large unless there is something causing them to be "reactive".  I do recommend that you establish with a PCP and discuss this with them as I would recommend getting an ultrasound of the areas to make sure that there is nothing more serious going on.  If you start to notice increased swelling, fever, chills, pain in the area please return here or go to the emergency room for further evaluation and management.

## 2023-06-10 NOTE — ED Provider Notes (Signed)
[facility]    CSN: [id number] Arrival date & time: 06/10/23  1704      History   Chief Complaint Chief Complaint  Patient presents with   Medication Refill   Possible Hernia    HPI Keith Contreras is a 28 y.o. male.   HPI    He states he needs a refill of his albuterol inhaler. Reports he is not having exacerbation at this time and denies SOB, wheezing, coughing.  He states he also noticed  a small bump/lump in pelvic region on Sat - reports this is on the right side   He denies pain in the area.  He denies changes to bowel habits, bloating, abdominal pain, fatigue appetite changes.     Past Medical History:  Diagnosis Date   Asthma     There are no active problems to display for this patient.   Past Surgical History:  Procedure Laterality Date   TESTICULAR EXPLORATION Bilateral 01/24/2017   Procedure: Scrotal Exporation with Left Testicular Torsion Bilateral Pexy.;  Surgeon: Sebastian Ache, MD;  Location: [facility] OR;  Service: Urology;  Laterality: Bilateral;       Home Medications    Prior to Admission medications   Medication Sig Start Date End Date Taking? Authorizing Provider  albuterol (VENTOLIN HFA) 108 (90 Base) MCG/ACT inhaler Inhale 1-2 puffs into the lungs every 6 (six) hours as needed for wheezing or shortness of breath. 06/10/23  Yes Swathi Dauphin E, PA-C  ipratropium-albuterol (DUONEB) 0.5-2.5 (3) MG/3ML SOLN Take 3 mLs by nebulization every 4 (four) hours as needed. 12/21/20   Wieters, Hallie C, PA-C  predniSONE (DELTASONE) 50 MG tablet Take 1 tablet (50 mg total) by mouth daily with breakfast. 04/24/22   Wallis Bamberg, PA-C  promethazine-dextromethorphan (PROMETHAZINE-DM) 6.25-15 MG/5ML syrup Take 2.5 mLs by mouth 3 (three) times daily as needed for cough. 04/24/22   Wallis Bamberg, PA-C    Family History Family History  Problem Relation Age of Onset   Diabetes Father     Social History Social History   Tobacco Use   Smoking status:  Never   Smokeless tobacco: Never  Vaping Use   Vaping status: Some Days  Substance Use Topics   Alcohol use: No   Drug use: Yes    Types: Marijuana     Allergies   Patient has no known allergies.   Review of Systems Review of Systems  Constitutional:  Negative for chills, fatigue and fever.  Respiratory:  Negative for cough, shortness of breath and wheezing.   Gastrointestinal:  Negative for abdominal pain, anal bleeding, blood in stool, constipation, diarrhea, nausea and vomiting.  Genitourinary:  Negative for dysuria and hematuria.     Physical Exam Triage Vital Signs ED Triage Vitals  Encounter Vitals Group     BP 06/10/23 1751 (!) 149/79     Systolic BP Percentile --      Diastolic BP Percentile --      Pulse Rate 06/10/23 1751 97     Resp 06/10/23 1751 18     Temp 06/10/23 1751 98.2 F (36.8 C)     Temp Source 06/10/23 1751 Oral     SpO2 06/10/23 1751 97 %     Weight --      Height --      Head Circumference --      Peak Flow --      Pain Score 06/10/23 1750 0     Pain Loc --      Pain  Education --      Exclude from [employer] Chart --    No data found.  Updated Vital Signs BP (!) 149/79 (BP Location: Left Arm)   Pulse 97   Temp 98.2 F (36.8 C) (Oral)   Resp 18   SpO2 97%   Visual Acuity Right Eye Distance:   Left Eye Distance:   Bilateral Distance:    Right Eye Near:   Left Eye Near:    Bilateral Near:     Physical Exam Vitals reviewed.  Constitutional:      General: He is awake.     Appearance: Normal appearance. He is well-developed and well-groomed.  HENT:     Head: Normocephalic and atraumatic.  Pulmonary:     Effort: Pulmonary effort is normal.  Musculoskeletal:     Cervical back: Normal range of motion and neck supple.  Lymphadenopathy:     Lower Body: Right inguinal adenopathy present.     Comments: 2 enlarged lymph nodes present in the right inguinal area.  1 is approximately 1.5 centimeters in diameter, mobile, nontender. The  other lymph node is closer to the groin area and feels a bit deeper.  This 1 is approximately 2 cm in diameter, mobile, nontender.  Skin:    General: Skin is warm and dry.  Neurological:     General: No focal deficit present.     Mental Status: He is alert and oriented to person, place, and time.  Psychiatric:        Mood and Affect: Mood normal.        Behavior: Behavior normal. Behavior is cooperative.        Thought Content: Thought content normal.        Judgment: Judgment normal.      UC Treatments / Results  Labs (all labs ordered are listed, but only abnormal results are displayed) Labs Reviewed  CYTOLOGY, (ORAL, ANAL, URETHRAL) ANCILLARY ONLY    EKG   Radiology No results found.  Procedures Procedures (including critical care time)  Medications Ordered in UC Medications - No data to display  Initial Impression / Assessment and Plan / UC Course  I have reviewed the triage vital signs and the nursing notes.  Pertinent labs & imaging results that were available during my care of the patient were reviewed by me and considered in my medical decision making (see chart for details).      Final Clinical Impressions(s) / UC Diagnoses   Final diagnoses:  Lymphadenopathy, inguinal  Medication refill    Albuterol refills provided per patient request.  He denies current shortness of breath, fever, chills, coughing, trouble breathing.  Lymphadenopathy, inguinal Patient reports noticing 2 small nodular lumps in the inguinal area on Saturday.  These appear consistent with lymph nodes and are both mobile and nontender.  He denies recent vaccines, medication changes, recent illness.  Patient education materials provided.  Recommend that he establishes with a PCP for further follow-up and management.  Discussed that he will likely need an ultrasound of the area for further evaluation.  Will get cytology to rule out potential STD today.  Results to dictate further management.   Follow-up as needed with PCP once established.    Discharge Instructions      I suspect that the lumps that you are feeling in your groin area are probably lymph nodes.  These are usually not this large unless there is something causing them to be "reactive".  I do recommend that you establish with a  PCP and discuss this with them as I would recommend getting an ultrasound of the areas to make sure that there is nothing more serious going on.  If you start to notice increased swelling, fever, chills, pain in the area please return here or go to the emergency room for further evaluation and management.     ED Prescriptions     Medication Sig Dispense Auth. Provider   albuterol (VENTOLIN HFA) 108 (90 Base) MCG/ACT inhaler Inhale 1-2 puffs into the lungs every 6 (six) hours as needed for wheezing or shortness of breath. 8 g Evamae Rowen E, PA-C      PDMP not reviewed this encounter.   Providence Crosby, PA-C 06/10/23 8295

## 2023-06-12 LAB — CYTOLOGY, (ORAL, ANAL, URETHRAL) ANCILLARY ONLY
Chlamydia: NEGATIVE
Comment: NEGATIVE
Comment: NEGATIVE
Comment: NORMAL
Neisseria Gonorrhea: NEGATIVE
Trichomonas: POSITIVE — AB

## 2023-06-13 ENCOUNTER — Telehealth (HOSPITAL_BASED_OUTPATIENT_CLINIC_OR_DEPARTMENT_OTHER): Payer: Self-pay

## 2023-06-13 MED ORDER — METRONIDAZOLE 500 MG PO TABS: 2000.0000 mg | ORAL_TABLET | Freq: Once | ORAL | 0 refills | Status: AC

## 2023-06-13 NOTE — Telephone Encounter (Signed)
Per protocol, pt requires tx with metronidazole. Attempted to reach patient x1. LVM.  Rx sent to pharmacy on file.

## 2023-07-04 ENCOUNTER — Telehealth (HOSPITAL_COMMUNITY): Payer: Self-pay

## 2023-07-04 NOTE — Telephone Encounter (Signed)
 Called and spoke with the pharmacy. They state they do not have the prescription on file. They state they never received the RX. Informed the pharmacy that since the Patient was seen almost a month ago on 06/10/23 and needs a refill he will have to come back in to be seen.  Pharmacy verbalized understanding and states they will call the Patient.

## 2023-07-04 NOTE — Telephone Encounter (Signed)
 Informed by the front desk staff: "The pharmacy called his name is Keith Contreras he said that they didn't receive the inhaler description and needs to know the name of it [id number]"  Patient was seen 06/10/2023 and prescribed an Albuterol inhaler with one refill. Calling the pharmacy for clarification of what is needed.

## 2024-04-24 ENCOUNTER — Encounter (HOSPITAL_COMMUNITY): Payer: Self-pay

## 2024-04-24 ENCOUNTER — Ambulatory Visit (HOSPITAL_COMMUNITY)
Admission: EM | Admit: 2024-04-24 | Discharge: 2024-04-24 | Disposition: A | Discharge status: Home / Self Care | Payer: Self-pay

## 2024-04-24 DIAGNOSIS — Z113 Encounter for screening for infections with a predominantly sexual mode of transmission: Secondary | ICD-10-CM | POA: Insufficient documentation

## 2024-04-24 LAB — HIV ANTIBODY (ROUTINE TESTING W REFLEX): HIV Screen 4th Generation wRfx: NONREACTIVE

## 2024-04-24 NOTE — ED Triage Notes (Signed)
 Patient requesting full panel STD testing. States no known positive exposure. Patient requesting Swab and blood work. Denis any current symptoms.

## 2024-04-24 NOTE — ED Provider Notes (Addendum)
 " [facility]    CSN: [id number] Arrival date & time: 04/24/24  0801      History   Chief Complaint Chief Complaint  Patient presents with   SEXUALLY TRANSMITTED DISEASE    HPI Keith Contreras is a 28 y.o. male.   This 28 year old male is being seen for STI screening.  He denies known exposure.  He denies symptoms.  He reports he has a new partner and would like to be tested for peace of mind.  He denies headache, dizziness.  He denies mouth sores, sore throat.  He denies chest pain, shortness of breath.  He denies abdominal pain, nausea, vomiting.  He denies urinary symptoms including dysuria, urgency, frequency.  He denies penile discharge, pain, swelling.  He denies testicular pain or swelling.  He denies any rashes.     Past Medical History:  Diagnosis Date   Asthma     There are no active problems to display for this patient.   Past Surgical History:  Procedure Laterality Date   TESTICULAR EXPLORATION Bilateral 01/24/2017   Procedure: Scrotal Exporation with Left Testicular Torsion Bilateral Pexy.;  Surgeon: Alvaro Hummer, MD;  Location: [facility] OR;  Service: Urology;  Laterality: Bilateral;       Home Medications    Prior to Admission medications  Medication Sig Start Date End Date Taking? Authorizing Provider  albuterol  (VENTOLIN  HFA) 108 (90 Base) MCG/ACT inhaler Inhale 1-2 puffs into the lungs every 6 (six) hours as needed for wheezing or shortness of breath. 06/10/23  Yes Mecum, Erin E, PA-C  ipratropium-albuterol  (DUONEB) 0.5-2.5 (3) MG/3ML SOLN Take 3 mLs by nebulization every 4 (four) hours as needed. 12/21/20   Wieters, Hallie C, PA-C  predniSONE  (DELTASONE ) 50 MG tablet Take 1 tablet (50 mg total) by mouth daily with breakfast. 04/24/22   Christopher Savannah, PA-C  promethazine -dextromethorphan (PROMETHAZINE -DM) 6.25-15 MG/5ML syrup Take 2.5 mLs by mouth 3 (three) times daily as needed for cough. 04/24/22   Christopher Savannah, PA-C    Family History Family  History  Problem Relation Age of Onset   Diabetes Father     Social History Social History[1]   Allergies   Patient has no known allergies.   Review of Systems Review of Systems  Constitutional:  Negative for appetite change, chills, fatigue and fever.  HENT:  Negative for mouth sores and sore throat.   Respiratory:  Negative for shortness of breath.   Cardiovascular:  Negative for chest pain.  Gastrointestinal:  Negative for abdominal pain, nausea and vomiting.  Genitourinary:  Negative for difficulty urinating, dysuria, frequency, genital sores, penile discharge, penile pain, penile swelling, scrotal swelling, testicular pain and urgency.  Skin:  Negative for color change and rash.  All other systems reviewed and are negative.    Physical Exam Triage Vital Signs ED Triage Vitals  Encounter Vitals Group     BP      Girls Systolic BP Percentile      Girls Diastolic BP Percentile      Boys Systolic BP Percentile      Boys Diastolic BP Percentile      Pulse      Resp      Temp      Temp src      SpO2      Weight      Height      Head Circumference      Peak Flow      Pain Score      Pain Loc  Pain Education      Exclude from Growth Chart    No data found.  Updated Vital Signs BP 139/77 (BP Location: Right Arm)   Pulse 89   Temp 99.8 F (37.7 C) (Oral)   Resp 16   Ht 6' (1.829 m)   Wt 155 lb (70.3 kg)   SpO2 95%   BMI 21.02 kg/m   Visual Acuity Right Eye Distance:   Left Eye Distance:   Bilateral Distance:    Right Eye Near:   Left Eye Near:    Bilateral Near:     Physical Exam Vitals and nursing note reviewed.  Constitutional:      General: He is awake. He is not in acute distress.    Appearance: Normal appearance. He is well-developed.     Comments: Pleasant male appearing stated age found sitting in chair in no acute distress.  HENT:     Head: Normocephalic and atraumatic.     Mouth/Throat:     Lips: Pink.     Mouth: Mucous  membranes are moist.  Eyes:     Conjunctiva/sclera: Conjunctivae normal.  Cardiovascular:     Rate and Rhythm: Normal rate and regular rhythm.     Heart sounds: Normal heart sounds. No murmur heard. Pulmonary:     Effort: Pulmonary effort is normal. No respiratory distress.     Breath sounds: Normal breath sounds.  Abdominal:     General: Bowel sounds are normal.     Palpations: Abdomen is soft.     Tenderness: There is no abdominal tenderness.  Skin:    General: Skin is warm and dry.  Neurological:     Mental Status: He is alert.  Psychiatric:        Mood and Affect: Mood normal.        Behavior: Behavior is cooperative.      UC Treatments / Results  Labs (all labs ordered are listed, but only abnormal results are displayed) Labs Reviewed  SYPHILIS: RPR W/REFLEX TO RPR TITER AND TREPONEMAL ANTIBODIES, TRADITIONAL SCREENING AND DIAGNOSIS ALGORITHM  HIV ANTIBODY (ROUTINE TESTING W REFLEX)  CYTOLOGY, (ORAL, ANAL, URETHRAL) ANCILLARY ONLY    EKG   Radiology No results found.  Procedures Procedures (including critical care time)  Medications Ordered in UC Medications - No data to display  Initial Impression / Assessment and Plan / UC Course  I have reviewed the triage vital signs and the nursing notes.  Pertinent labs & imaging results that were available during my care of the patient were reviewed by me and considered in my medical decision making (see chart for details).     Vitals and triage reviewed, patient is hemodynamically stable.  Patient presents for full STI screening.  Denies known exposure.  Denies symptoms.  Cytology swab obtained.  HIV, syphilis screening obtained as well.  Staff will contact if results are positive to initiate appropriate treatment.  Plan of care, follow-up care, return precautions given, no questions at this time. Final Clinical Impressions(s) / UC Diagnoses   Final diagnoses:  Screening for STD (sexually transmitted disease)      Discharge Instructions      STD testing pending, this will take 2-3 days to result. We will only call you if your testing is positive for any infection(s) and we will provide treatment.  Avoid sexual intercourse until your STD results come back.  If any of your STD results are positive, you will need to avoid sexual intercourse for 7 days while you  are being treated to prevent spread of STD.  Condom use is the best way to prevent spread of STDs. Notify partner(s) of any positive results.  Return to urgent care as needed.       ED Prescriptions   None    PDMP not reviewed this encounter.    Lennice Jon BROCKS, FNP 04/24/24 [phone]     [1]  Social History Tobacco Use   Smoking status: Never   Smokeless tobacco: Never  Vaping Use   Vaping status: Some Days  Substance Use Topics   Alcohol use: No   Drug use: Yes    Types: Marijuana     Lennice Jon BROCKS, FNP 04/24/24 0902  "

## 2024-04-24 NOTE — Discharge Instructions (Addendum)

## 2024-04-25 LAB — SYPHILIS: RPR W/REFLEX TO RPR TITER AND TREPONEMAL ANTIBODIES, TRADITIONAL SCREENING AND DIAGNOSIS ALGORITHM: RPR Ser Ql: NONREACTIVE

## 2024-04-26 LAB — CYTOLOGY, (ORAL, ANAL, URETHRAL) ANCILLARY ONLY
Chlamydia: NEGATIVE
Comment: NEGATIVE
Comment: NEGATIVE
Comment: NORMAL
Neisseria Gonorrhea: NEGATIVE
Trichomonas: NEGATIVE

## 2024-05-14 ENCOUNTER — Ambulatory Visit (HOSPITAL_COMMUNITY): Payer: Self-pay
